# Patient Record
Sex: Male | Born: 1970 | Race: White | Hispanic: No | Marital: Single | Smoking: Never smoker
Health system: Southern US, Community
[De-identification: ages and names within clinical notes are randomized; demographics above are authoritative.]

## PROBLEM LIST (undated history)

## (undated) HISTORY — PX: NO PAST SURGERIES: SHX2092

---

## 2004-10-17 ENCOUNTER — Ambulatory Visit: Payer: Self-pay | Admitting: Family Medicine

## 2012-05-12 LAB — CBC AND DIFFERENTIAL
HEMATOCRIT: 44 % (ref 41–53)
HEMOGLOBIN: 15.1 g/dL (ref 13.5–17.5)
Platelets: 264 10*3/uL (ref 150–399)
WBC: 5.5 10^3/mL

## 2012-05-12 LAB — LIPID PANEL
Cholesterol: 147 mg/dL (ref 0–200)
HDL: 30 mg/dL — AB (ref 35–70)
LDL Cholesterol: 85 mg/dL
LDL/HDL RATIO: 2.8
TRIGLYCERIDES: 160 mg/dL (ref 40–160)

## 2012-05-12 LAB — TSH: TSH: 1.12 u[IU]/mL (ref <=5.90)

## 2014-04-04 LAB — BASIC METABOLIC PANEL
BUN: 14 mg/dL (ref 4–21)
CREATININE: 0.9 mg/dL (ref <=1.3)
GLUCOSE: 101 mg/dL
POTASSIUM: 4.3 mmol/L (ref 3.4–5.3)
Sodium: 141 mmol/L (ref 137–147)

## 2014-04-04 LAB — HEPATIC FUNCTION PANEL
ALT: 45 U/L — AB (ref 10–40)
AST: 31 U/L (ref 14–40)

## 2014-08-28 DIAGNOSIS — I10 Essential (primary) hypertension: Secondary | ICD-10-CM | POA: Insufficient documentation

## 2014-08-28 DIAGNOSIS — E669 Obesity, unspecified: Secondary | ICD-10-CM | POA: Insufficient documentation

## 2014-10-10 ENCOUNTER — Ambulatory Visit: Payer: Self-pay | Admitting: Family Medicine

## 2014-11-14 ENCOUNTER — Encounter: Payer: Self-pay | Admitting: Family Medicine

## 2014-11-14 ENCOUNTER — Ambulatory Visit (INDEPENDENT_AMBULATORY_CARE_PROVIDER_SITE_OTHER): Payer: Managed Care, Other (non HMO) | Admitting: Family Medicine

## 2014-11-14 VITALS — BP 128/80 | HR 80 | Temp 98.2°F | Resp 16 | Wt 294.0 lb

## 2014-11-14 DIAGNOSIS — E669 Obesity, unspecified: Secondary | ICD-10-CM | POA: Diagnosis not present

## 2014-11-14 DIAGNOSIS — I10 Essential (primary) hypertension: Secondary | ICD-10-CM | POA: Diagnosis not present

## 2014-11-14 DIAGNOSIS — Z23 Encounter for immunization: Secondary | ICD-10-CM

## 2014-11-14 NOTE — Progress Notes (Signed)
Patient ID: Robert Dalton, male   DOB: 1970-10-18, 44 y.o.   MRN: [id number]    Subjective:  HPI  Hypertension, follow-up:  BP Readings from Last 3 Encounters:  04/04/14 122/82    He was last seen for hypertension 6 months ago.  BP at that visit was 122/82. Management changes since that visit include none. He reports good compliance with treatment. He is exercising, mainly job related. He is adherent to low salt diet.   Outside blood pressures are not being checked. He is experiencing none.   Weight trend: stable Wt Readings from Last 3 Encounters:  04/04/14 287 lb (130.182 kg)     ------------------------------------------------------------------------     Prior to Admission medications   Medication Sig Start Date End Date Taking? Authorizing Provider  MULTIPLE VITAMINS PO Take by mouth.    Historical Provider, MD    Patient Active Problem List   Diagnosis Date Noted  . Obesity 11/14/2014  . Essential (primary) hypertension 08/28/2014  . Adiposity 08/28/2014    No past medical history on file.  History   Social History  . Marital Status: Single    Spouse Name: N/A  . Number of Children: N/A  . Years of Education: N/A   Occupational History  . Not on file.   Social History Main Topics  . Smoking status: Not on file  . Smokeless tobacco: Not on file  . Alcohol Use: Not on file  . Drug Use: Not on file  . Sexual Activity: Not on file   Other Topics Concern  . Not on file   Social History Narrative  . No narrative on file    Allergies  Allergen Reactions  . Prednisone Swelling    Legs    Review of Systems  Constitutional: Negative.   HENT: Negative.   Eyes: Negative.   Respiratory: Negative.   Cardiovascular: Negative.   Gastrointestinal: Negative.   Skin: Negative.   Neurological: Negative.        A full tingling and both hands. Very vague with onset says this feels like is a stiffness also.  Psychiatric/Behavioral: Negative.   All  other systems reviewed and are negative.   Immunization History  Administered Date(s) Administered  . Td 09/30/2004   Objective:  Blood pressure 128/80, pulse 80, temperature 98.2 F (36.8 C), temperature source Oral, resp. rate 16, weight 294 lb (133.358 kg).   Physical Exam  Constitutional: He is oriented to person, place, and time and well-developed, well-nourished, and in no distress.  HENT:  Head: Normocephalic and atraumatic.  Right Ear: External ear normal.  Left Ear: External ear normal.  Nose: Nose normal.  Eyes: Conjunctivae are normal. Pupils are equal, round, and reactive to light.  Neck: Normal range of motion. Neck supple.  Cardiovascular: Normal rate, regular rhythm and normal heart sounds.   Pulmonary/Chest: Effort normal and breath sounds normal.  Abdominal: Soft. Bowel sounds are normal.  Neurological: He is alert and oriented to person, place, and time.  Skin: Skin is warm and dry.  Psychiatric: Mood, memory, affect and judgment normal.    Lab Results  Component Value Date   WBC 5.5 05/12/2012   HGB 15.1 05/12/2012   HCT 44 05/12/2012   PLT 264 05/12/2012   CHOL 147 05/12/2012   TRIG 160 05/12/2012   HDL 30* 05/12/2012   LDLCALC 85 05/12/2012   TSH 1.12 05/12/2012    CMP     Component Value Date/Time   NA 141 04/04/2014   K  4.3 04/04/2014   BUN 14 04/04/2014   CREATININE 0.9 04/04/2014   AST 31 04/04/2014   ALT 45* 04/04/2014    Assessment and Plan :   Obesity D and E discussed. Waist is 42 inches--long term goal is 36 inches.  I have done the exam and reviewed the above chart and it is accurate to the best of my knowledge.   Miguel Aschoff MD  [facility] 11/14/2014 3:10 PM

## 2015-01-23 ENCOUNTER — Ambulatory Visit: Payer: Managed Care, Other (non HMO) | Admitting: Family Medicine

## 2015-05-20 ENCOUNTER — Ambulatory Visit (INDEPENDENT_AMBULATORY_CARE_PROVIDER_SITE_OTHER): Payer: BC Managed Care – PPO | Admitting: Family Medicine

## 2015-05-20 VITALS — BP 142/92 | HR 68 | Temp 98.2°F | Resp 16 | Ht 72.0 in | Wt 293.0 lb

## 2015-05-20 DIAGNOSIS — Z87898 Personal history of other specified conditions: Secondary | ICD-10-CM

## 2015-05-20 DIAGNOSIS — I10 Essential (primary) hypertension: Secondary | ICD-10-CM

## 2015-05-20 DIAGNOSIS — Z9189 Other specified personal risk factors, not elsewhere classified: Secondary | ICD-10-CM

## 2015-05-20 MED ORDER — LISINOPRIL 40 MG PO TABS: 40.0000 mg | ORAL_TABLET | Freq: Every day | ORAL | Status: DC

## 2015-05-20 NOTE — Progress Notes (Signed)
Patient ID: Robert Dalton, male   DOB: June 08, 1970, 45 y.o.   MRN: [id number]   JAKYRON FABRO  MRN: [id number] DOB: Mar 27, 1971  Subjective:  HPI   1. Essential hypertension The patient is a 45 year old male who was originally scheduled for an annual physical.  However, he states he has had 2 CPE's this year through his work.  He has had a DOT PE and then he had one for [facility] therefor he does not think he needs a PE today.  He is concerned that he needs to be put back on his BP medication.  He was on Lisinopril at one time and had lost a lot of weight and was able to discontinue the medication.  Since then he has gained 40+ pounds and is having borderline readings.     Patient Active Problem List   Diagnosis Date Noted  . Obesity 11/14/2014  . Essential (primary) hypertension 08/28/2014  . Adiposity 08/28/2014    No past medical history on file.  Social History   Social History  . Marital Status: Single    Spouse Name: N/A  . Number of Children: N/A  . Years of Education: N/A   Occupational History  . Not on file.   Social History Main Topics  . Smoking status: Never Smoker   . Smokeless tobacco: Not on file  . Alcohol Use: No  . Drug Use: No  . Sexual Activity: Not on file   Other Topics Concern  . Not on file   Social History Narrative    Outpatient Prescriptions Prior to Visit  Medication Sig Dispense Refill  . MULTIPLE VITAMINS PO Take by mouth.     No facility-administered medications prior to visit.    Allergies  Allergen Reactions  . Prednisone Swelling    Legs    Review of Systems  Constitutional: Negative for fever and malaise/fatigue.  Respiratory: Negative for cough, shortness of breath and wheezing.   Cardiovascular: Negative for chest pain, palpitations, orthopnea and leg swelling.  Neurological: Negative for dizziness, weakness and headaches.   Objective:  BP 142/92 mmHg  Pulse 68  Temp(Src) 98.2 F (36.8 C) (Oral)  Resp  16  Ht 6' (1.829 m)  Wt 293 lb (132.904 kg)  BMI 39.73 kg/m2  Physical Exam  Assessment and Plan :   1. Essential hypertension  - COMPLETE METABOLIC PANEL WITH GFR - TSH - CBC With Differential/Platelet - lisinopril (PRINIVIL,ZESTRIL) 40 MG tablet; Take 1 tablet (40 mg total) by mouth daily.  Dispense: 90 tablet; Refill: 3 RTC 1-2 months. 2. At risk for cardiac dysfunction  - Lipid Panel With LDL/HDL Ratio 3. obesity  Miguel Aschoff MD [facility] 05/20/2015 9:08 AM

## 2015-05-21 LAB — LIPID PANEL WITH LDL/HDL RATIO
CHOLESTEROL TOTAL: 188 mg/dL (ref 100–199)
HDL: 36 mg/dL — ABNORMAL LOW (ref >39)
LDL Calculated: 119 mg/dL — ABNORMAL HIGH (ref 0–99)
LDl/HDL Ratio: 3.3 ratio units (ref 0.0–3.6)
Triglycerides: 165 mg/dL — ABNORMAL HIGH (ref 0–149)
VLDL Cholesterol Cal: 33 mg/dL (ref 5–40)

## 2015-05-21 LAB — CBC WITH DIFFERENTIAL/PLATELET
BASOS: 0 %
Basophils Absolute: 0 10*3/uL (ref 0.0–0.2)
EOS (ABSOLUTE): 0 10*3/uL (ref 0.0–0.4)
EOS: 0 %
HEMATOCRIT: 46.9 % (ref 37.5–51.0)
HEMOGLOBIN: 16.7 g/dL (ref 12.6–17.7)
IMMATURE GRANS (ABS): 0 10*3/uL (ref 0.0–0.1)
Immature Granulocytes: 0 %
LYMPHS ABS: 2.5 10*3/uL (ref 0.7–3.1)
Lymphs: 37 %
MCH: 31.5 pg (ref 26.6–33.0)
MCHC: 35.6 g/dL (ref 31.5–35.7)
MCV: 88 fL (ref 79–97)
MONOCYTES: 6 %
Monocytes Absolute: 0.4 10*3/uL (ref 0.1–0.9)
NEUTROS PCT: 57 %
Neutrophils Absolute: 3.8 10*3/uL (ref 1.4–7.0)
Platelets: 254 10*3/uL (ref 150–379)
RBC: 5.31 x10E6/uL (ref 4.14–5.80)
RDW: 14.1 % (ref 12.3–15.4)
WBC: 6.8 10*3/uL (ref 3.4–10.8)

## 2015-05-21 LAB — TSH: TSH: 1.55 u[IU]/mL (ref 0.450–4.500)

## 2015-05-21 LAB — COMPREHENSIVE METABOLIC PANEL
ALBUMIN: 4.3 g/dL (ref 3.5–5.5)
ALT: 51 IU/L — ABNORMAL HIGH (ref 0–44)
AST: 29 IU/L (ref 0–40)
Albumin/Globulin Ratio: 1.8 (ref 1.1–2.5)
Alkaline Phosphatase: 94 IU/L (ref 39–117)
BUN / CREAT RATIO: 10 (ref 9–20)
BUN: 10 mg/dL (ref 6–24)
Bilirubin Total: 0.6 mg/dL (ref 0.0–1.2)
CO2: 22 mmol/L (ref 18–29)
CREATININE: 0.96 mg/dL (ref 0.76–1.27)
Calcium: 9.4 mg/dL (ref 8.7–10.2)
Chloride: 101 mmol/L (ref 96–106)
GFR, EST AFRICAN AMERICAN: 112 mL/min/{1.73_m2} (ref >59)
GFR, EST NON AFRICAN AMERICAN: 97 mL/min/{1.73_m2} (ref >59)
GLOBULIN, TOTAL: 2.4 g/dL (ref 1.5–4.5)
GLUCOSE: 99 mg/dL (ref 65–99)
Potassium: 4.7 mmol/L (ref 3.5–5.2)
SODIUM: 139 mmol/L (ref 134–144)
TOTAL PROTEIN: 6.7 g/dL (ref 6.0–8.5)

## 2016-09-21 ENCOUNTER — Ambulatory Visit (INDEPENDENT_AMBULATORY_CARE_PROVIDER_SITE_OTHER): Payer: Self-pay | Admitting: Family Medicine

## 2016-09-21 ENCOUNTER — Encounter: Payer: Self-pay | Admitting: Family Medicine

## 2016-09-21 VITALS — BP 142/68 | HR 72 | Temp 98.4°F | Resp 16 | Ht 71.0 in | Wt 303.0 lb

## 2016-09-21 DIAGNOSIS — I10 Essential (primary) hypertension: Secondary | ICD-10-CM

## 2016-09-21 DIAGNOSIS — H01004 Unspecified blepharitis left upper eyelid: Secondary | ICD-10-CM

## 2016-09-21 DIAGNOSIS — Z0000 Encounter for general adult medical examination without abnormal findings: Secondary | ICD-10-CM

## 2016-09-21 MED ORDER — CIPROFLOXACIN HCL 0.3 % OP SOLN: 2.0000 [drp] | OPHTHALMIC | 1 refills | Status: DC

## 2016-09-21 MED ORDER — LISINOPRIL 40 MG PO TABS: 40.0000 mg | ORAL_TABLET | Freq: Every day | ORAL | 3 refills | Status: DC

## 2016-09-21 NOTE — Progress Notes (Signed)
Patient: Robert Dalton Male    DOB: 09-20-1970   46 y.o.   MRN: [id number] Visit Date: 09/21/2016  Today's Provider: Wilhemena Durie, MD   Chief Complaint  Patient presents with  . Hypertension  . Eye Pain   Subjective:    HPI  Hypertension, follow-up:  BP Readings from Last 3 Encounters:  09/21/16 (!) 142/68  05/20/15 (!) 142/92  11/14/14 128/80    He was last seen for hypertension 1 years ago.  BP at that visit was 142/92. Management since that visit includes . He reports good compliance with treatment. He is not having side effects.  He is not exercising. He is adherent to low salt diet.   Outside blood pressures are not being checked. He is experiencing none.  Patient denies exertional chest pressure/discomfort, lower extremity edema and palpitations.    Weight trend: stable Wt Readings from Last 3 Encounters:  09/21/16 (!) 303 lb (137.4 kg)  05/20/15 293 lb (132.9 kg)  11/14/14 294 lb (133.4 kg)    Current diet: well balanced  Ref Range & Units 101yrago  Glucose 65 - 99 mg/dL 99   BUN 6 - 24 mg/dL 10   Creatinine, Ser 0.76 - 1.27 mg/dL 0.96   GFR calc non Af Amer >59 mL/min/1.73 97   GFR calc Af Amer >59 mL/min/1.73 112   BUN/Creatinine Ratio 9 - 20 10   Sodium 134 - 144 mmol/L 139   Potassium 3.5 - 5.2 mmol/L 4.7   Chloride 96 - 106 mmol/L 101   CO2 18 - 29 mmol/L 22   Calcium 8.7 - 10.2 mg/dL 9.4   Total Protein 6.0 - 8.5 g/dL 6.7   Albumin 3.5 - 5.5 g/dL 4.3   Globulin, Total 1.5 - 4.5 g/dL 2.4   Albumin/Globulin Ratio 1.1 - 2.5 1.8   Bilirubin Total 0.0 - 1.2 mg/dL 0.6   Alkaline Phosphatase 39 - 117 IU/L 94   AST 0 - 40 IU/L 29   ALT 0 - 44 IU/L 51      Ref Range & Units 172yrgo  Cholesterol, Total 100 - 199 mg/dL 188   Triglycerides 0 - 149 mg/dL 165    HDL >39 mg/dL 36    VLDL Cholesterol Cal 5 - 40 mg/dL 33   LDL Calculated 0 - 99 mg/dL 119    LDl/HDL Ratio 0.0 - 3.6 ratio units 3.3        Eye pain: Patient reports that  he may have a stye on his upper left eyelid. He reports that he has had symptoms for about 2 days. Patient has been using OTC eye drops with no relief.     Allergies  Allergen Reactions  . Prednisone Swelling    Legs     Current Outpatient Prescriptions:  .  lisinopril (PRINIVIL,ZESTRIL) 40 MG tablet, Take 1 tablet (40 mg total) by mouth daily., Disp: 90 tablet, Rfl: 3 .  MULTIPLE VITAMINS PO, Take by mouth., Disp: , Rfl:   Review of Systems  Constitutional: Negative.   Eyes: Positive for pain and redness. Negative for discharge and itching.  Respiratory: Negative.   Cardiovascular: Negative.     Social History  Substance Use Topics  . Smoking status: Never Smoker  . Smokeless tobacco: Never Used  . Alcohol use No   Objective:   BP (!) 142/68 (BP Location: Right Arm, Patient Position: Sitting, Cuff Size: Large)   Pulse 72   Temp 98.4 F (  36.9 C)   Resp 16   Ht 5' 11"  (1.803 m)   Wt (!) 303 lb (137.4 kg)   SpO2 97%   BMI 42.26 kg/m  Vitals:   09/21/16 0912  BP: (!) 142/68  Pulse: 72  Resp: 16  Temp: 98.4 F (36.9 C)  SpO2: 97%  Weight: (!) 303 lb (137.4 kg)  Height: 5' 11"  (1.803 m)     Physical Exam  Constitutional: He appears well-developed and well-nourished.  Eyes:  Left upper lid is diffusely swollen.   Cardiovascular: Normal rate, regular rhythm and normal heart sounds.   Pulmonary/Chest: Effort normal and breath sounds normal.        Assessment & Plan:     1. Essential (primary) hypertension  - Comprehensive metabolic panel - TSH  2. Essential hypertension  - lisinopril (PRINIVIL,ZESTRIL) 40 MG tablet; Take 1 tablet (40 mg total) by mouth daily.  Dispense: 90 tablet; Refill: 3  3. Blepharitis of left upper eyelid, unspecified type Warm compresses and Johnson       I have done the exam and reviewed the above chart and it is accurate to the best of my knowledge. Development worker, community has been used in this note in any air is in the  dictation or transcription are unintentional.  Wilhemena Durie, MD  [place]

## 2016-09-22 LAB — COMPREHENSIVE METABOLIC PANEL
A/G RATIO: 1.5 (ref 1.2–2.2)
ALBUMIN: 4.1 g/dL (ref 3.5–5.5)
ALK PHOS: 87 IU/L (ref 39–117)
ALT: 56 IU/L — ABNORMAL HIGH (ref 0–44)
AST: 36 IU/L (ref 0–40)
BUN/Creatinine Ratio: 9 (ref 9–20)
BUN: 10 mg/dL (ref 6–24)
Bilirubin Total: 0.6 mg/dL (ref 0.0–1.2)
CO2: 26 mmol/L (ref 18–29)
CREATININE: 1.14 mg/dL (ref 0.76–1.27)
Calcium: 9.6 mg/dL (ref 8.7–10.2)
Chloride: 101 mmol/L (ref 96–106)
GFR calc Af Amer: 90 mL/min/{1.73_m2} (ref >59)
GFR, EST NON AFRICAN AMERICAN: 78 mL/min/{1.73_m2} (ref >59)
GLOBULIN, TOTAL: 2.8 g/dL (ref 1.5–4.5)
Glucose: 99 mg/dL (ref 65–99)
POTASSIUM: 4.8 mmol/L (ref 3.5–5.2)
SODIUM: 140 mmol/L (ref 134–144)
Total Protein: 6.9 g/dL (ref 6.0–8.5)

## 2016-09-22 LAB — TSH: TSH: 1.92 u[IU]/mL (ref 0.450–4.500)

## 2016-09-24 ENCOUNTER — Telehealth: Payer: Self-pay

## 2016-09-24 NOTE — Telephone Encounter (Signed)
-----   Message from Jerrol Banana., MD sent at 09/23/2016 10:42 AM EDT ----- Stable. Work on diet and exercise.

## 2016-09-24 NOTE — Telephone Encounter (Signed)
Tried calling patient to advised him of lab results below.  Left message to call back.

## 2017-07-07 ENCOUNTER — Encounter: Payer: Self-pay | Admitting: Family Medicine

## 2017-07-07 ENCOUNTER — Ambulatory Visit: Payer: Self-pay | Admitting: Family Medicine

## 2017-07-07 VITALS — BP 134/86 | HR 80 | Temp 98.2°F | Resp 16 | Wt 329.0 lb

## 2017-07-07 DIAGNOSIS — Z6841 Body Mass Index (BMI) 40.0 and over, adult: Secondary | ICD-10-CM

## 2017-07-07 DIAGNOSIS — I10 Essential (primary) hypertension: Secondary | ICD-10-CM

## 2017-07-07 NOTE — Progress Notes (Signed)
       Patient: Robert Dalton Male    DOB: 03/08/1971   47 y.o.   MRN: [id number] Visit Date: 07/07/2017  Today's Provider: Wilhemena Durie, MD   Chief Complaint  Patient presents with  . Hypertension    follow up   Subjective:    Hypertension  This is a chronic problem. The problem is unchanged. The problem is controlled. Pertinent negatives include no anxiety, blurred vision, chest pain, headaches, neck pain, orthopnea, palpitations, peripheral edema, PND, shortness of breath or sweats. There are no associated agents to hypertension. Risk factors for coronary artery disease include male gender and obesity. Past treatments include ACE inhibitors. There are no compliance problems.    BP Readings from Last 3 Encounters:  07/07/17 134/86  09/21/16 (!) 142/68  05/20/15 (!) 142/92        Allergies  Allergen Reactions  . Prednisone Swelling    Legs     Current Outpatient Medications:  .  lisinopril (PRINIVIL,ZESTRIL) 40 MG tablet, Take 1 tablet (40 mg total) by mouth daily., Disp: 90 tablet, Rfl: 3 .  MULTIPLE VITAMINS PO, Take by mouth., Disp: , Rfl:  .  ciprofloxacin (CILOXAN) 0.3 % ophthalmic solution, Place 2 drops into the left eye every 2 (two) hours. Administer 1 drop, every 2 hours, while awake, for 2 days. Then 1 drop, every 4 hours, while awake, for the next 5 days., Disp: 10 mL, Rfl: 1  Review of Systems  Constitutional: Negative.   Eyes: Negative for blurred vision.  Respiratory: Negative.  Negative for shortness of breath.   Cardiovascular: Negative.  Negative for chest pain, palpitations, orthopnea and PND.  Musculoskeletal: Negative for neck pain.  Neurological: Negative for dizziness, light-headedness and headaches.    Social History   Tobacco Use  . Smoking status: Never Smoker  . Smokeless tobacco: Never Used  Substance Use Topics  . Alcohol use: No   Objective:   BP 134/86 (BP Location: Right Arm, Patient Position: Sitting, Cuff Size: Large)    Pulse 80   Temp 98.2 F (36.8 C) (Oral)   Resp 16   Wt (!) 329 lb (149.2 kg)   BMI 45.89 kg/m  Vitals:   07/07/17 0913  BP: 134/86  Pulse: 80  Resp: 16  Temp: 98.2 F (36.8 C)  TempSrc: Oral  Weight: (!) 329 lb (149.2 kg)     Physical Exam  Constitutional: He is oriented to person, place, and time. He appears well-developed and well-nourished.  HENT:  Head: Normocephalic and atraumatic.  Eyes: Conjunctivae are normal. No scleral icterus.  Neck: No thyromegaly present.  Cardiovascular: Normal rate, regular rhythm and normal heart sounds.  Pulmonary/Chest: Effort normal and breath sounds normal.  Abdominal: Soft.  Neurological: He is alert and oriented to person, place, and time.  Skin: Skin is warm and dry.  Psychiatric: He has a normal mood and affect. His behavior is normal. Judgment and thought content normal.        Assessment & Plan:       HTN Obesity RTC 6 months.    Rolla Servidio Cranford Mon, MD  [facility]

## 2018-01-12 ENCOUNTER — Encounter: Payer: Self-pay | Admitting: Family Medicine

## 2018-02-23 ENCOUNTER — Other Ambulatory Visit: Payer: Self-pay | Admitting: Family Medicine

## 2018-02-23 DIAGNOSIS — I10 Essential (primary) hypertension: Secondary | ICD-10-CM

## 2018-02-23 MED ORDER — LISINOPRIL 40 MG PO TABS: 40.0000 mg | ORAL_TABLET | Freq: Every day | ORAL | 1 refills | Status: DC

## 2018-02-23 NOTE — Telephone Encounter (Signed)
Pt needing a 3 month supply refill on: lisinopril (PRINIVIL,ZESTRIL) 40 MG tablet - pt's appt w/ Rosanna Randy isn't until Dec.  Please fill at:  [facility] Address: [address] [place], [place] [address] Phone: [phone]   Thanks, [facility]

## 2018-02-24 ENCOUNTER — Other Ambulatory Visit: Payer: Self-pay

## 2018-02-24 DIAGNOSIS — I10 Essential (primary) hypertension: Secondary | ICD-10-CM

## 2018-02-24 MED ORDER — LISINOPRIL 40 MG PO TABS: 40.0000 mg | ORAL_TABLET | Freq: Every day | ORAL | 1 refills | Status: DC

## 2018-05-03 ENCOUNTER — Encounter: Payer: Self-pay | Admitting: Family Medicine

## 2018-05-17 ENCOUNTER — Encounter: Payer: Self-pay | Admitting: Family Medicine

## 2018-05-17 ENCOUNTER — Ambulatory Visit (INDEPENDENT_AMBULATORY_CARE_PROVIDER_SITE_OTHER): Payer: BLUE CROSS/BLUE SHIELD | Admitting: Family Medicine

## 2018-05-17 VITALS — BP 122/78 | HR 70 | Temp 98.3°F | Resp 16 | Ht 70.0 in | Wt 319.0 lb

## 2018-05-17 DIAGNOSIS — Z800 Family history of malignant neoplasm of digestive organs: Secondary | ICD-10-CM

## 2018-05-17 DIAGNOSIS — Z0000 Encounter for general adult medical examination without abnormal findings: Secondary | ICD-10-CM | POA: Diagnosis not present

## 2018-05-17 DIAGNOSIS — Z125 Encounter for screening for malignant neoplasm of prostate: Secondary | ICD-10-CM

## 2018-05-17 LAB — POCT URINALYSIS DIPSTICK
Bilirubin, UA: NEGATIVE
Blood, UA: NEGATIVE
Glucose, UA: NEGATIVE
Ketones, UA: NEGATIVE
Leukocytes, UA: NEGATIVE
Nitrite, UA: NEGATIVE
Protein, UA: NEGATIVE
Spec Grav, UA: 1.02 (ref 1.010–1.025)
UROBILINOGEN UA: 0.2 U/dL
pH, UA: 6 (ref 5.0–8.0)

## 2018-05-17 NOTE — Progress Notes (Signed)
Patient: Robert Dalton, Male    DOB: January 07, 1971, 48 y.o.   MRN: [id number] Visit Date: 05/17/2018  Today's Provider: Wilhemena Durie, MD   Chief Complaint  Patient presents with  . Annual Exam   Subjective:    Annual physical exam Robert Dalton is a 48 y.o. male who presents today for health maintenance and complete physical. He feels well. He reports exercising not regularly . He reports he is sleeping well.  He is single and lives in [place].  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  Feels well overall.  Td- 11/14/2014.     Review of Systems  Constitutional: Negative.   HENT: Negative.   Eyes: Negative.   Respiratory: Negative.   Cardiovascular: Negative.   Gastrointestinal: Negative.   Endocrine: Negative.   Genitourinary: Negative.   Musculoskeletal: Negative.   Skin: Negative.   Allergic/Immunologic: Negative.   Neurological: Negative.   Hematological: Negative.   Psychiatric/Behavioral: Negative.     Social History      He  reports that he has never smoked. He has never used smokeless tobacco. He reports that he does not drink alcohol or use drugs.       Social History   Socioeconomic History  . Marital status: Single    Spouse name: Not on file  . Number of children: Not on file  . Years of education: Not on file  . Highest education level: Not on file  Occupational History  . Not on file  Social Needs  . Financial resource strain: Not on file  . Food insecurity:    Worry: Not on file    Inability: Not on file  . Transportation needs:    Medical: Not on file    Non-medical: Not on file  Tobacco Use  . Smoking status: Never Smoker  . Smokeless tobacco: Never Used  Substance and Sexual Activity  . Alcohol use: No  . Drug use: No  . Sexual activity: Not on file  Lifestyle  . Physical activity:    Days per week: Not on file    Minutes per session: Not on file  . Stress: Not on file  Relationships  . Social  connections:    Talks on phone: Not on file    Gets together: Not on file    Attends religious service: Not on file    Active member of club or organization: Not on file    Attends meetings of clubs or organizations: Not on file    Relationship status: Not on file  Other Topics Concern  . Not on file  Social History Narrative  . Not on file    No past medical history on file.   Patient Active Problem List   Diagnosis Date Noted  . Obesity 11/14/2014  . Essential (primary) hypertension 08/28/2014  . Adiposity 08/28/2014    No past surgical history on file.  Family History        Family Status  Relation Name Status  . Mother  Alive  . Sister  Alive  . Brother  Alive  . Father  (Not Specified)        His family history includes Alcohol abuse in his father; Allergies in his father; Colon cancer in his father.      Allergies  Allergen Reactions  . Prednisone Swelling    Legs     Current Outpatient Medications:  .  lisinopril (PRINIVIL,ZESTRIL) 40 MG tablet,  Take 1 tablet (40 mg total) by mouth daily., Disp: 90 tablet, Rfl: 1 .  MULTIPLE VITAMINS PO, Take by mouth., Disp: , Rfl:  .  ciprofloxacin (CILOXAN) 0.3 % ophthalmic solution, Place 2 drops into the left eye every 2 (two) hours. Administer 1 drop, every 2 hours, while awake, for 2 days. Then 1 drop, every 4 hours, while awake, for the next 5 days. (Patient not taking: Reported on 05/17/2018), Disp: 10 mL, Rfl: 1   Patient Care Team: Jerrol Banana., MD as PCP - General (Family Medicine)      Objective:   Vitals: BP 122/78 (BP Location: Left Arm, Patient Position: Sitting, Cuff Size: Large)   Pulse 70   Temp 98.3 F (36.8 C)   Resp 16   Ht 5' 10"  (1.778 m)   Wt (!) 319 lb (144.7 kg)   SpO2 98%   BMI 45.77 kg/m    Vitals:   05/17/18 0911  BP: 122/78  Pulse: 70  Resp: 16  Temp: 98.3 F (36.8 C)  SpO2: 98%  Weight: (!) 319 lb (144.7 kg)  Height: 5' 10"  (1.778 m)     Physical  Exam Constitutional:      Appearance: Normal appearance. He is well-developed. He is obese.  HENT:     Head: Normocephalic and atraumatic.     Right Ear: External ear normal.     Left Ear: External ear normal.     Nose: Nose normal.  Eyes:     Conjunctiva/sclera: Conjunctivae normal.  Neck:     Thyroid: No thyromegaly.  Cardiovascular:     Rate and Rhythm: Normal rate and regular rhythm.     Pulses: Normal pulses.     Heart sounds: Normal heart sounds.  Pulmonary:     Effort: Pulmonary effort is normal.     Breath sounds: Normal breath sounds.  Abdominal:     Palpations: Abdomen is soft.  Genitourinary:    Penis: Normal.      Scrotum/Testes: Normal.  Skin:    General: Skin is warm and dry.  Neurological:     General: No focal deficit present.     Mental Status: He is alert and oriented to person, place, and time. Mental status is at baseline.  Psychiatric:        Mood and Affect: Mood normal.        Behavior: Behavior normal.        Thought Content: Thought content normal.        Judgment: Judgment normal.      Depression Screen PHQ 2/9 Scores 05/17/2018 09/21/2016  PHQ - 2 Score 0 0  PHQ- 9 Score - 0      Assessment & Plan:     Routine Health Maintenance and Physical Exam  Exercise Activities and Dietary recommendations Goals   None     Immunization History  Administered Date(s) Administered  . MMR 11/13/1991  . Td 09/30/2004, 11/14/2014    Health Maintenance  Topic Date Due  . HIV Screening  08/10/1987  . INFLUENZA VACCINE  12/02/2017  . TETANUS/TDAP  11/13/2024     Discussed health benefits of physical activity, and encouraged him to engage in regular exercise appropriate for his age and condition.  1. Annual physical exam  - CBC with Differential/Platelet - Comprehensive metabolic panel - Lipid panel - PSA - TSH - POCT urinalysis dipstick  2. Prostate cancer screening   3. Family history of colon cancer in father Discussed  colonoscopy but patient  prefers Cologuard. Father had colon cancer in his 65s. - Cologuard  I, [place] Presley, CMA, am acting as a Education administrator for [employer]. Rosanna Randy, MD.   I have done the exam and reviewed the above chart and it is accurate to the best of my knowledge. Development worker, community has been used in this note in any air is in the dictation or transcription are unintentional.  Wilhemena Durie, MD  [place]

## 2018-05-18 LAB — CBC WITH DIFFERENTIAL/PLATELET
BASOS ABS: 0 10*3/uL (ref 0.0–0.2)
Basos: 0 %
EOS (ABSOLUTE): 0 10*3/uL (ref 0.0–0.4)
Eos: 0 %
Hematocrit: 46.6 % (ref 37.5–51.0)
Hemoglobin: 16.6 g/dL (ref 13.0–17.7)
Immature Grans (Abs): 0 10*3/uL (ref 0.0–0.1)
Immature Granulocytes: 0 %
Lymphocytes Absolute: 2.4 10*3/uL (ref 0.7–3.1)
Lymphs: 31 %
MCH: 31.1 pg (ref 26.6–33.0)
MCHC: 35.6 g/dL (ref 31.5–35.7)
MCV: 87 fL (ref 79–97)
Monocytes Absolute: 0.6 10*3/uL (ref 0.1–0.9)
Monocytes: 8 %
Neutrophils Absolute: 4.8 10*3/uL (ref 1.4–7.0)
Neutrophils: 61 %
Platelets: 259 10*3/uL (ref 150–450)
RBC: 5.34 x10E6/uL (ref 4.14–5.80)
RDW: 13.4 % (ref 11.6–15.4)
WBC: 7.9 10*3/uL (ref 3.4–10.8)

## 2018-05-18 LAB — COMPREHENSIVE METABOLIC PANEL
ALT: 30 IU/L (ref 0–44)
AST: 23 IU/L (ref 0–40)
Albumin/Globulin Ratio: 1.4 (ref 1.2–2.2)
Albumin: 4.2 g/dL (ref 3.5–5.5)
Alkaline Phosphatase: 101 IU/L (ref 39–117)
BUN / CREAT RATIO: 11 (ref 9–20)
BUN: 11 mg/dL (ref 6–24)
Bilirubin Total: 0.6 mg/dL (ref 0.0–1.2)
CO2: 24 mmol/L (ref 20–29)
Calcium: 9.3 mg/dL (ref 8.7–10.2)
Chloride: 101 mmol/L (ref 96–106)
Creatinine, Ser: 0.97 mg/dL (ref 0.76–1.27)
GFR calc Af Amer: 109 mL/min/{1.73_m2} (ref >59)
GFR calc non Af Amer: 94 mL/min/{1.73_m2} (ref >59)
GLUCOSE: 102 mg/dL — AB (ref 65–99)
Globulin, Total: 3 g/dL (ref 1.5–4.5)
Potassium: 4.6 mmol/L (ref 3.5–5.2)
Sodium: 139 mmol/L (ref 134–144)
Total Protein: 7.2 g/dL (ref 6.0–8.5)

## 2018-05-18 LAB — LIPID PANEL
CHOLESTEROL TOTAL: 169 mg/dL (ref 100–199)
Chol/HDL Ratio: 5 ratio (ref 0.0–5.0)
HDL: 34 mg/dL — ABNORMAL LOW (ref >39)
LDL Calculated: 97 mg/dL (ref 0–99)
Triglycerides: 188 mg/dL — ABNORMAL HIGH (ref 0–149)
VLDL Cholesterol Cal: 38 mg/dL (ref 5–40)

## 2018-05-18 LAB — PSA: Prostate Specific Ag, Serum: 0.5 ng/mL (ref 0.0–4.0)

## 2018-05-18 LAB — TSH: TSH: 1.53 u[IU]/mL (ref 0.450–4.500)

## 2018-06-14 LAB — COLOGUARD: Cologuard: NEGATIVE

## 2018-06-20 ENCOUNTER — Telehealth: Payer: Self-pay

## 2018-06-20 NOTE — Telephone Encounter (Signed)
Patient advised.

## 2018-06-20 NOTE — Telephone Encounter (Signed)
Called patient to advise that cologuard results are negative. Left message to call back.

## 2018-06-20 NOTE — Telephone Encounter (Signed)
Pt returned call ° °teri °

## 2018-08-23 ENCOUNTER — Other Ambulatory Visit: Payer: Self-pay | Admitting: Family Medicine

## 2018-08-23 DIAGNOSIS — I10 Essential (primary) hypertension: Secondary | ICD-10-CM

## 2019-02-21 ENCOUNTER — Other Ambulatory Visit: Payer: Self-pay | Admitting: Family Medicine

## 2019-02-21 DIAGNOSIS — I10 Essential (primary) hypertension: Secondary | ICD-10-CM

## 2019-08-29 NOTE — Progress Notes (Deleted)
Complete physical exam   Patient: Robert Dalton   DOB: [birth year]   49 y.o. Male  MRN: [id number] Visit Date: 08/29/2019  Today's healthcare provider: Wilhemena Durie, MD   No chief complaint on file.  Subjective    Robert Dalton is a 49 y.o. male who presents today for a complete physical exam.  He reports consuming a {diet types:17450} diet. {Exercise:19826} He generally feels {well/fairly well/poorly:18703}. He reports sleeping {well/fairly well/poorly:18703}. He {does/does not:200015} have additional problems to discuss today.  HPI  ***  No past medical history on file. No past surgical history on file. Social History   Socioeconomic History  . Marital status: Single    Spouse name: Not on file  . Number of children: Not on file  . Years of education: Not on file  . Highest education level: Not on file  Occupational History  . Not on file  Tobacco Use  . Smoking status: Never Smoker  . Smokeless tobacco: Never Used  Substance and Sexual Activity  . Alcohol use: No  . Drug use: No  . Sexual activity: Not on file  Other Topics Concern  . Not on file  Social History Narrative  . Not on file   Social Determinants of Health   Financial Resource Strain:   . Difficulty of Paying Living Expenses:   Food Insecurity:   . Worried About Charity fundraiser in the Last Year:   . Arboriculturist in the Last Year:   Transportation Needs:   . Film/video editor (Medical):   Marland Kitchen Lack of Transportation (Non-Medical):   Physical Activity:   . Days of Exercise per Week:   . Minutes of Exercise per Session:   Stress:   . Feeling of Stress :   Social Connections:   . Frequency of Communication with Friends and Family:   . Frequency of Social Gatherings with Friends and Family:   . Attends Religious Services:   . Active Member of Clubs or Organizations:   . Attends Archivist Meetings:   Marland Kitchen Marital Status:   Intimate Partner Violence:   . Fear of  Current or Ex-Partner:   . Emotionally Abused:   Marland Kitchen Physically Abused:   . Sexually Abused:    Family Status  Relation Name Status  . Mother  Alive  . Sister  Alive  . Brother  Alive  . Father  (Not Specified)   Family History  Problem Relation Age of Onset  . Alcohol abuse Father   . Colon cancer Father   . Allergies Father    Allergies  Allergen Reactions  . Prednisone Swelling    Legs    Patient Care Team: Jerrol Banana., MD as PCP - General (Family Medicine)   Medications: Outpatient Medications Prior to Visit  Medication Sig  . ciprofloxacin (CILOXAN) 0.3 % ophthalmic solution Place 2 drops into the left eye every 2 (two) hours. Administer 1 drop, every 2 hours, while awake, for 2 days. Then 1 drop, every 4 hours, while awake, for the next 5 days. (Patient not taking: Reported on 05/17/2018)  . lisinopril (ZESTRIL) 40 MG tablet TAKE 1 TABLET BY MOUTH EVERY DAY  . MULTIPLE VITAMINS PO Take by mouth.   No facility-administered medications prior to visit.    Review of Systems  {Show previous labs (optional):23779::" "}  Objective    There were no vitals taken for this visit. {Show previous vital signs (optional):23777::" "}  Physical Exam  ***  Depression Screen  PHQ 2/9 Scores 05/17/2018 09/21/2016  PHQ - 2 Score 0 0  PHQ- 9 Score - 0    No results found for any visits on 08/30/19.  Assessment & Plan    Routine Health Maintenance and Physical Exam  Exercise Activities and Dietary recommendations Goals   None     Immunization History  Administered Date(s) Administered  . MMR 11/13/1991  . Td 09/30/2004, 11/14/2014    Health Maintenance  Topic Date Due  . HIV Screening  Never done  . COVID-19 Vaccine (1) Never done  . INFLUENZA VACCINE  12/03/2019  . TETANUS/TDAP  11/13/2024    Discussed health benefits of physical activity, and encouraged him to engage in regular exercise appropriate for his age and condition.  ***  No follow-ups  on file.     {provider attestation***:1}   Wilhemena Durie, MD  [facility] [phone] (phone) [phone] (fax)  [place]

## 2019-08-30 ENCOUNTER — Encounter: Payer: BLUE CROSS/BLUE SHIELD | Admitting: Family Medicine

## 2019-09-05 NOTE — Progress Notes (Deleted)
Complete physical exam   Patient: Robert Dalton   DOB: 1970/08/18   49 y.o. Male  MRN: [id number] Visit Date: 09/06/2019  Today's healthcare provider: Wilhemena Durie, MD   No chief complaint on file.  Subjective    Robert Dalton is a 49 y.o. male who presents today for a complete physical exam.  He reports consuming a {diet types:17450} diet. {Exercise:19826} He generally feels {well/fairly well/poorly:18703}. He reports sleeping {well/fairly well/poorly:18703}. He {does/does not:200015} have additional problems to discuss today.  HPI  ***  No past medical history on file. No past surgical history on file. Social History   Socioeconomic History  . Marital status: Single    Spouse name: Not on file  . Number of children: Not on file  . Years of education: Not on file  . Highest education level: Not on file  Occupational History  . Not on file  Tobacco Use  . Smoking status: Never Smoker  . Smokeless tobacco: Never Used  Substance and Sexual Activity  . Alcohol use: No  . Drug use: No  . Sexual activity: Not on file  Other Topics Concern  . Not on file  Social History Narrative  . Not on file   Social Determinants of Health   Financial Resource Strain:   . Difficulty of Paying Living Expenses:   Food Insecurity:   . Worried About Charity fundraiser in the Last Year:   . Arboriculturist in the Last Year:   Transportation Needs:   . Film/video editor (Medical):   Marland Kitchen Lack of Transportation (Non-Medical):   Physical Activity:   . Days of Exercise per Week:   . Minutes of Exercise per Session:   Stress:   . Feeling of Stress :   Social Connections:   . Frequency of Communication with Friends and Family:   . Frequency of Social Gatherings with Friends and Family:   . Attends Religious Services:   . Active Member of Clubs or Organizations:   . Attends Archivist Meetings:   Marland Kitchen Marital Status:   Intimate Partner Violence:   . Fear of  Current or Ex-Partner:   . Emotionally Abused:   Marland Kitchen Physically Abused:   . Sexually Abused:    Family Status  Relation Name Status  . Mother  Alive  . Sister  Alive  . Brother  Alive  . Father  (Not Specified)   Family History  Problem Relation Age of Onset  . Alcohol abuse Father   . Colon cancer Father   . Allergies Father    Allergies  Allergen Reactions  . Prednisone Swelling    Legs    Patient Care Team: Jerrol Banana., MD as PCP - General (Family Medicine)   Medications: Outpatient Medications Prior to Visit  Medication Sig  . ciprofloxacin (CILOXAN) 0.3 % ophthalmic solution Place 2 drops into the left eye every 2 (two) hours. Administer 1 drop, every 2 hours, while awake, for 2 days. Then 1 drop, every 4 hours, while awake, for the next 5 days. (Patient not taking: Reported on 05/17/2018)  . lisinopril (ZESTRIL) 40 MG tablet TAKE 1 TABLET BY MOUTH EVERY DAY  . MULTIPLE VITAMINS PO Take by mouth.   No facility-administered medications prior to visit.    Review of Systems  {Show previous labs (optional):23779::" "}  Objective    There were no vitals taken for this visit. {Show previous vital signs (optional):23777::" "}  Physical Exam  ***  Depression Screen  PHQ 2/9 Scores 05/17/2018 09/21/2016  PHQ - 2 Score 0 0  PHQ- 9 Score - 0    No results found for any visits on 09/06/19.  Assessment & Plan    Routine Health Maintenance and Physical Exam  Exercise Activities and Dietary recommendations Goals   None     Immunization History  Administered Date(s) Administered  . MMR 11/13/1991  . Td 09/30/2004, 11/14/2014    Health Maintenance  Topic Date Due  . HIV Screening  Never done  . COVID-19 Vaccine (1) Never done  . INFLUENZA VACCINE  12/03/2019  . TETANUS/TDAP  11/13/2024    Discussed health benefits of physical activity, and encouraged him to engage in regular exercise appropriate for his age and condition.  ***  No follow-ups  on file.     {provider attestation***:1}   Wilhemena Durie, MD  [facility] [phone] (phone) [phone] (fax)  [facility]

## 2019-09-06 ENCOUNTER — Encounter: Payer: BC Managed Care – PPO | Admitting: Family Medicine

## 2019-09-15 NOTE — Progress Notes (Signed)
Complete physical exam   Patient: Robert Dalton   DOB: 12-12-1970   49 y.o. Male  MRN: [id number] Visit Date: 09/20/2019  Today's healthcare provider: Wilhemena Durie, MD   Chief Complaint  Patient presents with  . Annual Exam   [employer] as a scribe for Wilhemena Durie, MD.,have documented all relevant documentation on the behalf of Wilhemena Durie, MD,as directed by  Wilhemena Durie, MD while in the presence of Wilhemena Durie, MD.  Subjective    Robert Dalton is a 49 y.o. male who presents today for a complete physical exam.  He reports consuming a general diet. The patient does not participate in regular exercise at present. He generally feels well. He reports sleeping well. He does have additional problems to discuss today. Patient C/O left heel pain "feeling of fluid" that's gradually improving.  Feeling well and ready to make lifestyle changes to get the weight off.    History reviewed. No pertinent past medical history. History reviewed. No pertinent surgical history. Social History   Socioeconomic History  . Marital status: Single    Spouse name: Not on file  . Number of children: Not on file  . Years of education: Not on file  . Highest education level: Not on file  Occupational History  . Not on file  Tobacco Use  . Smoking status: Never Smoker  . Smokeless tobacco: Never Used  Substance and Sexual Activity  . Alcohol use: No  . Drug use: No  . Sexual activity: Not on file  Other Topics Concern  . Not on file  Social History Narrative  . Not on file   Social Determinants of Health   Financial Resource Strain:   . Difficulty of Paying Living Expenses:   Food Insecurity:   . Worried About Charity fundraiser in the Last Year:   . Arboriculturist in the Last Year:   Transportation Needs:   . Film/video editor (Medical):   Marland Kitchen Lack of Transportation (Non-Medical):   Physical Activity:   . Days of Exercise per Week:    . Minutes of Exercise per Session:   Stress:   . Feeling of Stress :   Social Connections:   . Frequency of Communication with Friends and Family:   . Frequency of Social Gatherings with Friends and Family:   . Attends Religious Services:   . Active Member of Clubs or Organizations:   . Attends Archivist Meetings:   Marland Kitchen Marital Status:   Intimate Partner Violence:   . Fear of Current or Ex-Partner:   . Emotionally Abused:   Marland Kitchen Physically Abused:   . Sexually Abused:    Family Status  Relation Name Status  . Mother  Alive  . Sister  Alive  . Brother  Alive  . Father  (Not Specified)   Family History  Problem Relation Age of Onset  . Alcohol abuse Father   . Colon cancer Father   . Allergies Father    Allergies  Allergen Reactions  . Prednisone Swelling    Legs    Patient Care Team: Jerrol Banana., MD as PCP - General (Family Medicine)   Medications: Outpatient Medications Prior to Visit  Medication Sig  . lisinopril (ZESTRIL) 40 MG tablet TAKE 1 TABLET BY MOUTH EVERY DAY  . MULTIPLE VITAMINS PO Take by mouth.  . [DISCONTINUED] ciprofloxacin (CILOXAN) 0.3 % ophthalmic solution Place 2 drops into the  left eye every 2 (two) hours. Administer 1 drop, every 2 hours, while awake, for 2 days. Then 1 drop, every 4 hours, while awake, for the next 5 days. (Patient not taking: Reported on 05/17/2018)   No facility-administered medications prior to visit.    Review of Systems  Constitutional: Negative.   HENT: Negative.   Eyes: Negative.   Respiratory: Negative.   Cardiovascular: Negative.   Gastrointestinal: Negative.   Endocrine: Negative.   Genitourinary: Negative.   Musculoskeletal: Negative.   Skin: Negative.   Allergic/Immunologic: Negative.   Neurological: Negative.   Hematological: Negative.   Psychiatric/Behavioral: Negative.        Objective    BP (!) 150/83 (BP Location: Right Arm, Patient Position: Sitting, Cuff Size: Large)    Pulse 86   Temp (!) 96.9 F (36.1 C) (Temporal)   Ht 5' 10"  (1.778 m)   Wt (!) 337 lb 3.2 oz (153 kg)   BMI 48.38 kg/m  Wt Readings from Last 3 Encounters:  09/20/19 (!) 337 lb 3.2 oz (153 kg)  05/17/18 (!) 319 lb (144.7 kg)  07/07/17 (!) 329 lb (149.2 kg)      Physical Exam Vitals reviewed.  Constitutional:      Appearance: Normal appearance. He is well-developed. He is obese.  HENT:     Head: Normocephalic and atraumatic.     Right Ear: External ear normal.     Left Ear: External ear normal.     Nose: Nose normal.  Eyes:     Conjunctiva/sclera: Conjunctivae normal.  Neck:     Thyroid: No thyromegaly.  Cardiovascular:     Rate and Rhythm: Normal rate and regular rhythm.     Pulses: Normal pulses.     Heart sounds: Normal heart sounds.  Pulmonary:     Effort: Pulmonary effort is normal.     Breath sounds: Normal breath sounds.  Abdominal:     Palpations: Abdomen is soft.  Genitourinary:    Penis: Normal.      Testes: Normal.  Skin:    General: Skin is warm and dry.  Neurological:     General: No focal deficit present.     Mental Status: He is alert and oriented to person, place, and time.  Psychiatric:        Mood and Affect: Mood normal.        Behavior: Behavior normal.        Thought Content: Thought content normal.        Judgment: Judgment normal.     BP (!) 150/83 (BP Location: Right Arm, Patient Position: Sitting, Cuff Size: Large)   Pulse 86   Temp (!) 96.9 F (36.1 C) (Temporal)   Ht 5' 10"  (1.778 m)   Wt (!) 337 lb 3.2 oz (153 kg)   BMI 48.38 kg/m   General Appearance:    Alert, cooperative, no distress, appears stated age  Head:    Normocephalic, without obvious abnormality, atraumatic  Eyes:    PERRL, conjunctiva/corneas clear, EOM's intact, fundi    benign, both eyes       Ears:    Normal TM's and external ear canals, both ears  Nose:   Nares normal, septum midline, mucosa normal, no drainage   or sinus tenderness  Throat:   Lips,  mucosa, and tongue normal; teeth and gums normal  Neck:   Supple, symmetrical, trachea midline, no adenopathy;       thyroid:  No enlargement/tenderness/nodules; no carotid   bruit or JVD  Back:     Symmetric, no curvature, ROM normal, no CVA tenderness  Lungs:     Clear to auscultation bilaterally, respirations unlabored  Chest wall:    No tenderness or deformity  Heart:    Regular rate and rhythm, S1 and S2 normal, no murmur, rub   or gallop  Abdomen:     Soft, non-tender, bowel sounds active all four quadrants,    no masses, no organomegaly  Genitalia:    Normal male without lesion, discharge or tenderness  Rectal:    Normal tone, normal prostate, no masses or tenderness;   guaiac negative stool  Extremities:   Extremities normal, atraumatic, no cyanosis or edema  Pulses:   2+ and symmetric all extremities  Skin:   Skin color, texture, turgor normal, no rashes or lesions  Lymph nodes:   Cervical, supraclavicular, and axillary nodes normal  Neurologic:   CNII-XII intact. Normal strength, sensation and reflexes      throughout   Depression Screen  PHQ 2/9 Scores 09/20/2019 05/17/2018 09/21/2016  PHQ - 2 Score 0 0 0  PHQ- 9 Score - - 0    No results found for any visits on 09/20/19.  Assessment & Plan    Routine Health Maintenance and Physical Exam  Exercise Activities and Dietary recommendations Goals   None     Immunization History  Administered Date(s) Administered  . MMR 11/13/1991  . Td 09/30/2004, 11/14/2014    Health Maintenance  Topic Date Due  . HIV Screening  Never done  . COVID-19 Vaccine (1) Never done  . INFLUENZA VACCINE  12/03/2019  . TETANUS/TDAP  11/13/2024    Discussed health benefits of physical activity, and encouraged him to engage in regular exercise appropriate for his age and condition.  1. Annual physical exam   2. Essential hypertension Follow up in 3 months patient to work on diet and exercise. - Lipid Profile - TSH - CBC with  Differential - Comprehensive Metabolic Panel (CMET)  3. Encounter for screening for HIV  - HIV Antibody (routine testing w rflx)  4. Prostate cancer screening  - PSA 5.  Morbid obesity 4-6 today.  Exercise discussed.  Weight watchers recommended.  Follow-up 3 months to recheck blood pressure and weight.  Return in about 3 months (around 12/21/2019).     I, Wilhemena Durie, MD, have reviewed all documentation for this visit. The documentation on 09/23/19 for the exam, diagnosis, procedures, and orders are all accurate and complete.    Robert Sexson Cranford Mon, MD  [facility] [phone] (phone) [phone] (fax)  [place]

## 2019-09-20 ENCOUNTER — Other Ambulatory Visit: Payer: Self-pay | Admitting: Family Medicine

## 2019-09-20 ENCOUNTER — Ambulatory Visit (INDEPENDENT_AMBULATORY_CARE_PROVIDER_SITE_OTHER): Payer: BC Managed Care – PPO | Admitting: Family Medicine

## 2019-09-20 ENCOUNTER — Encounter: Payer: Self-pay | Admitting: Family Medicine

## 2019-09-20 ENCOUNTER — Other Ambulatory Visit: Payer: Self-pay

## 2019-09-20 VITALS — BP 150/83 | HR 86 | Temp 96.9°F | Ht 70.0 in | Wt 337.2 lb

## 2019-09-20 DIAGNOSIS — E66813 Obesity, class 3: Secondary | ICD-10-CM

## 2019-09-20 DIAGNOSIS — Z114 Encounter for screening for human immunodeficiency virus [HIV]: Secondary | ICD-10-CM | POA: Diagnosis not present

## 2019-09-20 DIAGNOSIS — Z0000 Encounter for general adult medical examination without abnormal findings: Secondary | ICD-10-CM | POA: Diagnosis not present

## 2019-09-20 DIAGNOSIS — Z125 Encounter for screening for malignant neoplasm of prostate: Secondary | ICD-10-CM | POA: Diagnosis not present

## 2019-09-20 DIAGNOSIS — Z6841 Body Mass Index (BMI) 40.0 and over, adult: Secondary | ICD-10-CM

## 2019-09-20 DIAGNOSIS — I10 Essential (primary) hypertension: Secondary | ICD-10-CM | POA: Diagnosis not present

## 2019-09-21 LAB — TSH: TSH: 1.6 u[IU]/mL (ref 0.450–4.500)

## 2019-09-21 LAB — COMPREHENSIVE METABOLIC PANEL
ALT: 68 IU/L — ABNORMAL HIGH (ref 0–44)
AST: 41 IU/L — ABNORMAL HIGH (ref 0–40)
Albumin/Globulin Ratio: 1.6 (ref 1.2–2.2)
Albumin: 4.4 g/dL (ref 4.0–5.0)
Alkaline Phosphatase: 114 IU/L (ref 48–121)
BUN/Creatinine Ratio: 8 — ABNORMAL LOW (ref 9–20)
BUN: 9 mg/dL (ref 6–24)
Bilirubin Total: 0.6 mg/dL (ref 0.0–1.2)
CO2: 21 mmol/L (ref 20–29)
Calcium: 9.2 mg/dL (ref 8.7–10.2)
Chloride: 102 mmol/L (ref 96–106)
Creatinine, Ser: 1.06 mg/dL (ref 0.76–1.27)
GFR calc Af Amer: 96 mL/min/{1.73_m2} (ref >59)
GFR calc non Af Amer: 83 mL/min/{1.73_m2} (ref >59)
Globulin, Total: 2.7 g/dL (ref 1.5–4.5)
Glucose: 95 mg/dL (ref 65–99)
Potassium: 4.6 mmol/L (ref 3.5–5.2)
Sodium: 137 mmol/L (ref 134–144)
Total Protein: 7.1 g/dL (ref 6.0–8.5)

## 2019-09-21 LAB — LIPID PANEL
Chol/HDL Ratio: 4.8 ratio (ref 0.0–5.0)
Cholesterol, Total: 178 mg/dL (ref 100–199)
HDL: 37 mg/dL — ABNORMAL LOW (ref >39)
LDL Chol Calc (NIH): 109 mg/dL — ABNORMAL HIGH (ref 0–99)
Triglycerides: 185 mg/dL — ABNORMAL HIGH (ref 0–149)
VLDL Cholesterol Cal: 32 mg/dL (ref 5–40)

## 2019-09-21 LAB — CBC WITH DIFFERENTIAL/PLATELET
Basophils Absolute: 0 10*3/uL (ref 0.0–0.2)
Basos: 0 %
EOS (ABSOLUTE): 0 10*3/uL (ref 0.0–0.4)
Eos: 1 %
Hematocrit: 51.4 % — ABNORMAL HIGH (ref 37.5–51.0)
Hemoglobin: 17.5 g/dL (ref 13.0–17.7)
Immature Grans (Abs): 0.1 10*3/uL (ref 0.0–0.1)
Immature Granulocytes: 1 %
Lymphocytes Absolute: 2.4 10*3/uL (ref 0.7–3.1)
Lymphs: 33 %
MCH: 30.8 pg (ref 26.6–33.0)
MCHC: 34 g/dL (ref 31.5–35.7)
MCV: 91 fL (ref 79–97)
Monocytes Absolute: 0.5 10*3/uL (ref 0.1–0.9)
Monocytes: 7 %
Neutrophils Absolute: 4.3 10*3/uL (ref 1.4–7.0)
Neutrophils: 58 %
Platelets: 276 10*3/uL (ref 150–450)
RBC: 5.68 x10E6/uL (ref 4.14–5.80)
RDW: 13.8 % (ref 11.6–15.4)
WBC: 7.4 10*3/uL (ref 3.4–10.8)

## 2019-09-21 LAB — PSA: Prostate Specific Ag, Serum: 0.4 ng/mL (ref 0.0–4.0)

## 2019-09-21 LAB — HIV ANTIBODY (ROUTINE TESTING W REFLEX): HIV Screen 4th Generation wRfx: NONREACTIVE

## 2019-12-19 ENCOUNTER — Ambulatory Visit: Payer: BC Managed Care – PPO | Admitting: Family Medicine

## 2020-01-22 ENCOUNTER — Other Ambulatory Visit: Payer: Self-pay | Admitting: Family Medicine

## 2020-01-22 DIAGNOSIS — I10 Essential (primary) hypertension: Secondary | ICD-10-CM

## 2020-01-22 MED ORDER — LISINOPRIL 40 MG PO TABS: 40.0000 mg | ORAL_TABLET | Freq: Every day | ORAL | 1 refills | Status: DC

## 2020-01-22 NOTE — Telephone Encounter (Signed)
Medication refill sent to patient's pharmacy.

## 2020-01-22 NOTE — Telephone Encounter (Signed)
Medication Refill - Medication: lisinopril 69m  Has the patient contacted their pharmacy? No. (Agent: If no, request that the patient contact the pharmacy for the refill.) (Agent: If yes, when and what did the pharmacy advise?)  Preferred Pharmacy (with phone number or street name): [id number] [place], [place] Agent: Please be advised that RX refills may take up to 3 business days. We ask that you follow-up with your pharmacy.

## 2020-02-13 ENCOUNTER — Ambulatory Visit: Payer: BC Managed Care – PPO | Admitting: Family Medicine

## 2020-02-13 ENCOUNTER — Encounter: Payer: Self-pay | Admitting: Family Medicine

## 2020-02-13 ENCOUNTER — Other Ambulatory Visit: Payer: Self-pay

## 2020-02-13 VITALS — BP 120/88 | HR 95 | Temp 98.3°F | Resp 16 | Ht 70.0 in | Wt 329.0 lb

## 2020-02-13 DIAGNOSIS — I10 Essential (primary) hypertension: Secondary | ICD-10-CM | POA: Diagnosis not present

## 2020-02-13 DIAGNOSIS — Z6841 Body Mass Index (BMI) 40.0 and over, adult: Secondary | ICD-10-CM | POA: Diagnosis not present

## 2020-02-13 NOTE — Progress Notes (Signed)
I,April Miller,acting as a scribe for Wilhemena Durie, MD.,have documented all relevant documentation on the behalf of Wilhemena Durie, MD,as directed by  Wilhemena Durie, MD while in the presence of Wilhemena Durie, MD.  Established patient visit   Patient: Robert Dalton   DOB: 18-Dec-1970   49 y.o. Male  MRN: [id number] Visit Date: 02/13/2020  Today's healthcare provider: Wilhemena Durie, MD   Chief Complaint  Patient presents with  . Follow-up  . 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 Hypertension, follow-up  BP Readings from Last 3 Encounters:  09/20/19 (!) 150/83  05/17/18 122/78  07/07/17 134/86   Wt Readings from Last 3 Encounters:  09/20/19 (!) 337 lb 3.2 oz (153 kg)  05/17/18 (!) 319 lb (144.7 kg)  07/07/17 (!) 329 lb (149.2 kg)     He was last seen for hypertension 5 months ago.  BP at that visit was 150/93. Management since that visit includes; Follow up in 3 months patient to work on diet and exercise. He reports good compliance with treatment. He is not having side effects. none He is exercising. He is not adherent to low salt diet.   Outside blood pressures are not checking.  He does not smoke.  Use of agents associated with hypertension: none.   --------------------------------------------------------------------      Medications: Outpatient Medications Prior to Visit  Medication Sig  . lisinopril (ZESTRIL) 40 MG tablet Take 1 tablet (40 mg total) by mouth daily.  . MULTIPLE VITAMINS PO Take by mouth.   No facility-administered medications prior to visit.    Review of Systems  Constitutional: Negative for appetite change, chills and fever.  Respiratory: Negative for chest tightness, shortness of breath and wheezing.   Cardiovascular: Negative for chest pain and palpitations.  Gastrointestinal: Negative for abdominal pain, nausea and vomiting.       Objective    There were no  vitals taken for this visit. BP Readings from Last 3 Encounters:  02/13/20 120/88  09/20/19 (!) 150/83  05/17/18 122/78   Wt Readings from Last 3 Encounters:  02/13/20 (!) 329 lb (149.2 kg)  09/20/19 (!) 337 lb 3.2 oz (153 kg)  05/17/18 (!) 319 lb (144.7 kg)      Physical Exam Vitals reviewed.  Constitutional:      Appearance: Normal appearance. He is well-developed. He is obese.  HENT:     Head: Normocephalic and atraumatic.     Right Ear: External ear normal.     Left Ear: External ear normal.     Nose: Nose normal.  Eyes:     Conjunctiva/sclera: Conjunctivae normal.  Neck:     Thyroid: No thyromegaly.  Cardiovascular:     Rate and Rhythm: Normal rate and regular rhythm.     Pulses: Normal pulses.     Heart sounds: Normal heart sounds.  Pulmonary:     Effort: Pulmonary effort is normal.     Breath sounds: Normal breath sounds.  Abdominal:     Palpations: Abdomen is soft.  Skin:    General: Skin is warm and dry.  Neurological:     General: No focal deficit present.     Mental Status: He is alert and oriented to person, place, and time.  Psychiatric:        Mood and Affect: Mood normal.        Behavior: Behavior normal.  Thought Content: Thought content normal.        Judgment: Judgment normal.       No results found for any visits on 02/13/20.  Assessment & Plan       No follow-ups on file.      I, Wilhemena Durie, MD, have reviewed all documentation for this visit. The documentation on 02/18/20 for the exam, diagnosis, procedures, and orders are all accurate and complete.    Loma [place] Cranford Mon, MD  [facility] [phone] (phone) [phone] (fax)  [place]

## 2020-07-18 ENCOUNTER — Other Ambulatory Visit: Payer: Self-pay | Admitting: Family Medicine

## 2020-07-18 DIAGNOSIS — I10 Essential (primary) hypertension: Secondary | ICD-10-CM

## 2020-07-18 NOTE — Telephone Encounter (Signed)
Requested Prescriptions  Pending Prescriptions Disp Refills  . lisinopril (ZESTRIL) 40 MG tablet [Pharmacy Med Name: LISINOPRIL 40 MG TABLET] 90 tablet 0    Sig: TAKE 1 TABLET BY MOUTH EVERY DAY     Cardiovascular:  ACE Inhibitors Failed - 07/18/2020  1:35 AM      Failed - Cr in normal range and within 180 days    Creatinine, Ser  Date Value Ref Range Status  09/20/2019 1.06 0.76 - 1.27 mg/dL Final         Failed - K in normal range and within 180 days    Potassium  Date Value Ref Range Status  09/20/2019 4.6 3.5 - 5.2 mmol/L Final         Passed - Patient is not pregnant      Passed - Last BP in normal range    BP Readings from Last 1 Encounters:  02/13/20 120/88         Passed - Valid encounter within last 6 months    Recent Outpatient Visits          5 months ago Essential (primary) hypertension   [facility] Jerrol Banana., MD   10 months ago Annual physical exam   [facility] Jerrol Banana., MD   2 years ago Annual physical exam   [facility] Jerrol Banana., MD   3 years ago Essential (primary) hypertension   [facility] Jerrol Banana., MD   3 years ago Essential (primary) hypertension   [facility] Jerrol Banana., MD      Future Appointments            In 2 months Jerrol Banana., MD [facility], [place]

## 2020-10-08 ENCOUNTER — Encounter: Payer: Self-pay | Admitting: Family Medicine

## 2020-10-16 ENCOUNTER — Other Ambulatory Visit: Payer: Self-pay | Admitting: Family Medicine

## 2020-10-16 DIAGNOSIS — I10 Essential (primary) hypertension: Secondary | ICD-10-CM

## 2020-10-16 NOTE — Telephone Encounter (Signed)
Requested Prescriptions  Pending Prescriptions Disp Refills  . lisinopril (ZESTRIL) 40 MG tablet [Pharmacy Med Name: LISINOPRIL 40 MG TABLET] 30 tablet 0    Sig: TAKE 1 TABLET BY MOUTH EVERY DAY     Cardiovascular:  ACE Inhibitors Failed - 10/16/2020  1:10 AM      Failed - Cr in normal range and within 180 days    Creatinine, Ser  Date Value Ref Range Status  09/20/2019 1.06 0.76 - 1.27 mg/dL Final         Failed - K in normal range and within 180 days    Potassium  Date Value Ref Range Status  09/20/2019 4.6 3.5 - 5.2 mmol/L Final         Failed - Valid encounter within last 6 months    Recent Outpatient Visits          8 months ago Essential (primary) hypertension   [facility] Jerrol Banana., MD   1 year ago Annual physical exam   [facility] Jerrol Banana., MD   2 years ago Annual physical exam   [facility] Jerrol Banana., MD   3 years ago Essential (primary) hypertension   [facility] Jerrol Banana., MD   4 years ago Essential (primary) hypertension   [facility] Jerrol Banana., MD      Future Appointments            In 3 months Jerrol Banana., MD [facility] [place] - Patient is not pregnant      Passed - Last BP in normal range    BP Readings from Last 1 Encounters:  02/13/20 120/88         Patient needs an office visit for further refills. Courtesy refill.

## 2020-11-08 ENCOUNTER — Other Ambulatory Visit: Payer: Self-pay | Admitting: Family Medicine

## 2020-11-08 DIAGNOSIS — I10 Essential (primary) hypertension: Secondary | ICD-10-CM

## 2020-11-08 NOTE — Telephone Encounter (Signed)
  Notes to clinic:  Patient is schedule for appointment on 02/05/2021  Review for refills until that time    Requested Prescriptions  Pending Prescriptions Disp Refills   lisinopril (ZESTRIL) 40 MG tablet [Pharmacy Med Name: LISINOPRIL 40 MG TABLET] 30 tablet 0    Sig: TAKE 1 TABLET BY MOUTH EVERY DAY      Cardiovascular:  ACE Inhibitors Failed - 11/08/2020  9:30 AM      Failed - Cr in normal range and within 180 days    Creatinine, Ser  Date Value Ref Range Status  09/20/2019 1.06 0.76 - 1.27 mg/dL Final          Failed - K in normal range and within 180 days    Potassium  Date Value Ref Range Status  09/20/2019 4.6 3.5 - 5.2 mmol/L Final          Failed - Valid encounter within last 6 months    Recent Outpatient Visits           8 months ago Essential (primary) hypertension   [facility] Jerrol Banana., MD   1 year ago Annual physical exam   [facility] Jerrol Banana., MD   2 years ago Annual physical exam   [facility] Jerrol Banana., MD   3 years ago Essential (primary) hypertension   [facility] Jerrol Banana., MD   4 years ago Essential (primary) hypertension   [facility] Jerrol Banana., MD       Future Appointments             In 2 months Jerrol Banana., MD [facility] [place] - Patient is not pregnant      Passed - Last BP in normal range    BP Readings from Last 1 Encounters:  02/13/20 120/88

## 2021-02-05 ENCOUNTER — Other Ambulatory Visit: Payer: Self-pay

## 2021-02-05 ENCOUNTER — Ambulatory Visit (INDEPENDENT_AMBULATORY_CARE_PROVIDER_SITE_OTHER): Payer: BC Managed Care – PPO | Admitting: Family Medicine

## 2021-02-05 ENCOUNTER — Encounter: Payer: Self-pay | Admitting: Family Medicine

## 2021-02-05 VITALS — BP 128/84 | HR 72 | Temp 98.5°F | Ht 71.0 in | Wt 333.0 lb

## 2021-02-05 DIAGNOSIS — Z0000 Encounter for general adult medical examination without abnormal findings: Secondary | ICD-10-CM

## 2021-02-05 DIAGNOSIS — R109 Unspecified abdominal pain: Secondary | ICD-10-CM

## 2021-02-05 DIAGNOSIS — E66813 Obesity, class 3: Secondary | ICD-10-CM

## 2021-02-05 DIAGNOSIS — Z1211 Encounter for screening for malignant neoplasm of colon: Secondary | ICD-10-CM

## 2021-02-05 DIAGNOSIS — R1011 Right upper quadrant pain: Secondary | ICD-10-CM

## 2021-02-05 DIAGNOSIS — Z125 Encounter for screening for malignant neoplasm of prostate: Secondary | ICD-10-CM

## 2021-02-05 DIAGNOSIS — I10 Essential (primary) hypertension: Secondary | ICD-10-CM | POA: Diagnosis not present

## 2021-02-05 DIAGNOSIS — Z6841 Body Mass Index (BMI) 40.0 and over, adult: Secondary | ICD-10-CM

## 2021-02-05 LAB — POCT URINALYSIS DIPSTICK
Bilirubin, UA: NEGATIVE
Blood, UA: NEGATIVE
Glucose, UA: NEGATIVE
Ketones, UA: NEGATIVE
Leukocytes, UA: NEGATIVE
Nitrite, UA: NEGATIVE
Protein, UA: NEGATIVE
Spec Grav, UA: >=1.03 — AB (ref 1.010–1.025)
Urobilinogen, UA: 0.2 E.U./dL
pH, UA: 5 (ref 5.0–8.0)

## 2021-02-05 MED ORDER — CLENPIQ 10-3.5-12 MG-GM -GM/160ML PO SOLN: 1.0000 | Freq: Once | ORAL | 0 refills | Status: AC

## 2021-02-05 NOTE — Progress Notes (Signed)
Gastroenterology Pre-Procedure Review  Request Date: 05/12/2021 Requesting Physician: Dr. Vicente Males  PATIENT REVIEW QUESTIONS: The patient responded to the following health history questions as indicated:    1. Are you having any GI issues? no 2. Do you have a personal history of Polyps? no 3. Do you have a family history of Colon Cancer or Polyps? yes (Father colon polyps) 4. Diabetes Mellitus? no 5. Joint replacements in the past 12 months?no 6. Major health problems in the past 3 months?no 7. Any artificial heart valves, MVP, or defibrillator?no    MEDICATIONS & ALLERGIES:    Patient reports the following regarding taking any anticoagulation/antiplatelet therapy:   Plavix, Coumadin, Eliquis, Xarelto, Lovenox, Pradaxa, Brilinta, or Effient? no Aspirin? no  Patient confirms/reports the following medications:  Current Outpatient Medications  Medication Sig Dispense Refill   lisinopril (ZESTRIL) 40 MG tablet TAKE 1 TABLET BY MOUTH EVERY DAY 90 tablet 1   MULTIPLE VITAMINS PO Take by mouth.     No current facility-administered medications for this visit.    Patient confirms/reports the following allergies:  Allergies  Allergen Reactions   Prednisone Swelling    Legs    No orders of the defined types were placed in this encounter.   AUTHORIZATION INFORMATION Primary Insurance: 1D#: Group #:  Secondary Insurance: 1D#: Group #:  SCHEDULE INFORMATION: Date: 05/12/2021 Time: Location: [place]

## 2021-02-05 NOTE — Progress Notes (Signed)
Complete physical exam   Patient: Robert Dalton   DOB: Jul 08, 1970   50 y.o. Male  MRN: [id number] Visit Date: 02/05/2021  Today's healthcare provider: Wilhemena Durie, MD   No chief complaint on file.  Subjective    [name] is a 50 y.o. male who presents today for a complete physical exam.  He reports consuming a general diet. The patient does not participate in regular exercise at present. He generally feels well. He reports sleeping well. He does have additional problems to discuss today.  He is planning to go to nursing school in the future. HPI  Pt complaining of right sided pain around his ribs. Pt says it started about a year ago.  The pain has been coming and going but seems to be worsening.  Seems to be worse with  certain movements.  No past medical history on file. Past Surgical History:  Procedure Laterality Date   NO PAST SURGERIES     Social History   Socioeconomic History   Marital status: Single    Spouse name: Not on file   Number of children: Not on file   Years of education: Not on file   Highest education level: Not on file  Occupational History   Not on file  Tobacco Use   Smoking status: Never   Smokeless tobacco: Never  Vaping Use   Vaping Use: Never used  Substance and Sexual Activity   Alcohol use: No   Drug use: No   Sexual activity: Not on file  Other Topics Concern   Not on file  Social History Narrative   Not on file   Social Determinants of Health   Financial Resource Strain: Not on file  Food Insecurity: Not on file  Transportation Needs: Not on file  Physical Activity: Not on file  Stress: Not on file  Social Connections: Not on file  Intimate Partner Violence: Not on file   Family Status  Relation Name Status   Mother  Alive   Father  Alive   Sister  Alive   Brother  Alive   MGM  Alive   Neg Hx  (Not Specified)   Family History  Problem Relation Age of Onset   Healthy Mother    Alcohol abuse Father     Colon cancer Father    Allergies Father    Breast cancer Maternal Grandmother    Prostate cancer Neg Hx    Allergies  Allergen Reactions   Prednisone Swelling    Legs    Patient Care Team: Jerrol Banana., MD as PCP - General (Family Medicine)   Medications: Outpatient Medications Prior to Visit  Medication Sig   lisinopril (ZESTRIL) 40 MG tablet TAKE 1 TABLET BY MOUTH EVERY DAY   MULTIPLE VITAMINS PO Take by mouth.   No facility-administered medications prior to visit.    Review of Systems  All other systems reviewed and are negative.    Objective    BP 128/84 (BP Location: Right Arm, Patient Position: Sitting, Cuff Size: Large)   Pulse 72   Temp 98.5 F (36.9 C) (Oral)   Ht _0  (1.803 m)   Wt (!) 333 lb (151 kg)   SpO2 97%   BMI 46.44 kg/m    Physical Exam Vitals reviewed.  Constitutional:      Appearance: He is well-developed. He is obese.  HENT:     Head: Normocephalic and atraumatic.     Right Ear:  External ear normal.     Left Ear: External ear normal.     Nose: Nose normal.  Eyes:     Conjunctiva/sclera: Conjunctivae normal.  Neck:     Thyroid: No thyromegaly.  Cardiovascular:     Rate and Rhythm: Normal rate and regular rhythm.     Pulses: Normal pulses.     Heart sounds: Normal heart sounds.  Pulmonary:     Effort: Pulmonary effort is normal.     Breath sounds: Normal breath sounds.  Abdominal:     Palpations: Abdomen is soft.  Genitourinary:    Penis: Normal.      Testes: Normal.  Skin:    General: Skin is warm and dry.  Neurological:     General: No focal deficit present.     Mental Status: He is alert and oriented to person, place, and time.  Psychiatric:        Mood and Affect: Mood normal.        Behavior: Behavior normal.        Thought Content: Thought content normal.        Judgment: Judgment normal.      Last depression screening scores PHQ 2/9 Scores 02/13/2020 09/20/2019 05/17/2018  PHQ - 2 Score 0 0 0   PHQ- 9 Score 2 - -   Last fall risk screening Fall Risk  09/20/2019  Falls in the past year? 0  Number falls in past yr: 0  Injury with Fall? 0   Last Audit-C alcohol use screening Alcohol Use Disorder Test (AUDIT) 02/13/2020  1. How often do you have a drink containing alcohol? 0  2. How many drinks containing alcohol do you have on a typical day when you are drinking? 0  3. How often do you have six or more drinks on one occasion? 0  AUDIT-C Score 0  Alcohol Brief Interventions/Follow-up AUDIT Score <7 follow-up not indicated   A score of 3 or more in women, and 4 or more in men indicates increased risk for alcohol abuse, EXCEPT if all of the points are from question 1   No results found for any visits on 02/05/21.  Assessment & Plan    Routine Health Maintenance and Physical Exam  Exercise Activities and Dietary recommendations  Goals   None     Immunization History  Administered Date(s) Administered   MMR 11/13/1991   Td 09/30/2004, 11/14/2014    Health Maintenance  Topic Date Due   COVID-19 Vaccine (1) Never done   Hepatitis C Screening  Never done   INFLUENZA VACCINE  08/01/2021 (Originally 12/02/2020)   TETANUS/TDAP  11/13/2024   HIV Screening  Completed   HPV VACCINES  Aged Out    Discussed health benefits of physical activity, and encouraged him to engage in regular exercise appropriate for his age and condition.  1. Annual physical exam Refer to GI for consideration of screening colonoscopy - Lipid panel - TSH - CBC w/Diff/Platelet - Comprehensive Metabolic Panel (CMET)  2. Essential (primary) hypertension Controlled - Lipid panel - TSH - CBC w/Diff/Platelet - Comprehensive Metabolic Panel (CMET)  3. Class 3 severe obesity due to excess calories without serious comorbidity with body mass index (BMI) of 45.0 to 49.9 in adult [facility]) Diet and exercise stressed. - Lipid panel - TSH - CBC w/Diff/Platelet - Comprehensive Metabolic Panel (CMET)  4.  Prostate cancer screening  - PSA  5. Flank pain Obtain gallbladder/right upper quadrant ultrasound.  I really think this is musculoskeletal. - POCT  urinalysis dipstick  6. RUQ abdominal pain  - US Abdomen Limited RUQ (LIVER/GB)  7. Encounter for screening colonoscopy  - Ambulatory referral to Gastroenterology   No follow-ups on file.     I, Wilhemena Durie, MD, have reviewed all documentation for this visit. The documentation on 02/09/21 for the exam, diagnosis, procedures, and orders are all accurate and complete.    Hooper Petteway Cranford Mon, MD  [facility] [phone] (phone) [phone] (fax)  [place]

## 2021-02-06 LAB — COMPREHENSIVE METABOLIC PANEL
ALT: 44 IU/L (ref 0–44)
AST: 32 IU/L (ref 0–40)
Albumin/Globulin Ratio: 1.4 (ref 1.2–2.2)
Albumin: 4.3 g/dL (ref 4.0–5.0)
Alkaline Phosphatase: 113 IU/L (ref 44–121)
BUN/Creatinine Ratio: 9 (ref 9–20)
BUN: 9 mg/dL (ref 6–24)
Bilirubin Total: 0.9 mg/dL (ref 0.0–1.2)
CO2: 23 mmol/L (ref 20–29)
Calcium: 9.4 mg/dL (ref 8.7–10.2)
Chloride: 102 mmol/L (ref 96–106)
Creatinine, Ser: 1.02 mg/dL (ref 0.76–1.27)
Globulin, Total: 3.1 g/dL (ref 1.5–4.5)
Glucose: 96 mg/dL (ref 70–99)
Potassium: 4.7 mmol/L (ref 3.5–5.2)
Sodium: 140 mmol/L (ref 134–144)
Total Protein: 7.4 g/dL (ref 6.0–8.5)
eGFR: 91 mL/min/{1.73_m2} (ref >59)

## 2021-02-06 LAB — CBC WITH DIFFERENTIAL/PLATELET
Basophils Absolute: 0 10*3/uL (ref 0.0–0.2)
Basos: 0 %
EOS (ABSOLUTE): 0 10*3/uL (ref 0.0–0.4)
Eos: 0 %
Hematocrit: 50 % (ref 37.5–51.0)
Hemoglobin: 17.4 g/dL (ref 13.0–17.7)
Immature Grans (Abs): 0 10*3/uL (ref 0.0–0.1)
Immature Granulocytes: 0 %
Lymphocytes Absolute: 2.4 10*3/uL (ref 0.7–3.1)
Lymphs: 26 %
MCH: 31.1 pg (ref 26.6–33.0)
MCHC: 34.8 g/dL (ref 31.5–35.7)
MCV: 89 fL (ref 79–97)
Monocytes Absolute: 0.6 10*3/uL (ref 0.1–0.9)
Monocytes: 7 %
Neutrophils Absolute: 6 10*3/uL (ref 1.4–7.0)
Neutrophils: 67 %
Platelets: 261 10*3/uL (ref 150–450)
RBC: 5.6 x10E6/uL (ref 4.14–5.80)
RDW: 12.9 % (ref 11.6–15.4)
WBC: 9.1 10*3/uL (ref 3.4–10.8)

## 2021-02-06 LAB — LIPID PANEL
Chol/HDL Ratio: 5.2 ratio — ABNORMAL HIGH (ref 0.0–5.0)
Cholesterol, Total: 186 mg/dL (ref 100–199)
HDL: 36 mg/dL — ABNORMAL LOW (ref >39)
LDL Chol Calc (NIH): 121 mg/dL — ABNORMAL HIGH (ref 0–99)
Triglycerides: 160 mg/dL — ABNORMAL HIGH (ref 0–149)
VLDL Cholesterol Cal: 29 mg/dL (ref 5–40)

## 2021-02-06 LAB — PSA: Prostate Specific Ag, Serum: 0.4 ng/mL (ref 0.0–4.0)

## 2021-02-06 LAB — TSH: TSH: 1.43 u[IU]/mL (ref 0.450–4.500)

## 2021-02-17 ENCOUNTER — Other Ambulatory Visit: Payer: Self-pay

## 2021-02-17 ENCOUNTER — Ambulatory Visit
Admission: RE | Admit: 2021-02-17 | Discharge: 2021-02-17 | Disposition: A | Discharge status: Home / Self Care | Payer: BC Managed Care – PPO | Source: Ambulatory Visit | Attending: Family Medicine | Admitting: Family Medicine

## 2021-02-17 DIAGNOSIS — R1011 Right upper quadrant pain: Secondary | ICD-10-CM | POA: Diagnosis not present

## 2021-05-08 ENCOUNTER — Telehealth: Payer: Self-pay

## 2021-05-08 NOTE — Telephone Encounter (Signed)
Patient called to cancel his procedure patient stated he will not be rescheduling with [place]

## 2021-05-09 ENCOUNTER — Other Ambulatory Visit: Payer: Self-pay | Admitting: Family Medicine

## 2021-05-09 DIAGNOSIS — I10 Essential (primary) hypertension: Secondary | ICD-10-CM

## 2021-05-09 NOTE — Telephone Encounter (Signed)
Requested Prescriptions  Pending Prescriptions Disp Refills   lisinopril (ZESTRIL) 40 MG tablet [Pharmacy Med Name: LISINOPRIL 40 MG TABLET] 90 tablet 0    Sig: TAKE 1 TABLET BY MOUTH EVERY DAY     Cardiovascular:  ACE Inhibitors Passed - 05/09/2021  1:21 AM      Passed - Cr in normal range and within 180 days    Creatinine, Ser  Date Value Ref Range Status  02/05/2021 1.02 0.76 - 1.27 mg/dL Final         Passed - K in normal range and within 180 days    Potassium  Date Value Ref Range Status  02/05/2021 4.7 3.5 - 5.2 mmol/L Final         Passed - Patient is not pregnant      Passed - Last BP in normal range    BP Readings from Last 1 Encounters:  02/05/21 128/84         Passed - Valid encounter within last 6 months    Recent Outpatient Visits          3 months ago Annual physical exam   [facility] Jerrol Banana., MD   1 year ago Essential (primary) hypertension   [facility] Jerrol Banana., MD   1 year ago Annual physical exam   [facility] Jerrol Banana., MD   2 years ago Annual physical exam   [facility] Jerrol Banana., MD   3 years ago Essential (primary) hypertension   [facility] Jerrol Banana., MD      Future Appointments            In 7 months Jerrol Banana., MD [facility], [place]

## 2021-05-12 ENCOUNTER — Ambulatory Visit
Admission: RE | Admit: 2021-05-12 | Payer: BC Managed Care – PPO | Source: Home / Self Care | Admitting: Gastroenterology

## 2021-05-12 ENCOUNTER — Encounter: Admission: RE | Payer: Self-pay | Source: Home / Self Care

## 2021-05-12 SURGERY — COLONOSCOPY WITH PROPOFOL
Anesthesia: General

## 2021-08-13 ENCOUNTER — Other Ambulatory Visit: Payer: Self-pay | Admitting: Family Medicine

## 2021-08-13 DIAGNOSIS — I10 Essential (primary) hypertension: Secondary | ICD-10-CM

## 2021-08-13 NOTE — Telephone Encounter (Signed)
Requested Prescriptions  ?Pending Prescriptions Disp Refills  ?? lisinopril (ZESTRIL) 40 MG tablet [Pharmacy Med Name: LISINOPRIL 40 MG TABLET] 90 tablet 0  ?  Sig: TAKE 1 TABLET BY MOUTH EVERY DAY  ?  ? Cardiovascular:  ACE Inhibitors Failed - 08/13/2021  1:51 AM  ?  ?  Failed - Cr in normal range and within 180 days  ?  Creatinine, Ser  ?Date Value Ref Range Status  ?02/05/2021 1.02 0.76 - 1.27 mg/dL Final  ?   ?  ?  Failed - K in normal range and within 180 days  ?  Potassium  ?Date Value Ref Range Status  ?02/05/2021 4.7 3.5 - 5.2 mmol/L Final  ?   ?  ?  Failed - Valid encounter within last 6 months  ?  Recent Outpatient Visits   ?      ? 6 months ago Annual physical exam  ? [facility] Jerrol Banana., MD  ? 1 year ago Essential (primary) hypertension  ? [facility] Jerrol Banana., MD  ? 1 year ago Annual physical exam  ? [facility] Jerrol Banana., MD  ? 3 years ago Annual physical exam  ? [facility] Jerrol Banana., MD  ? 4 years ago Essential (primary) hypertension  ? [facility] Jerrol Banana., MD  ?  ?  ? ?  ?  ?  Passed - Patient is not pregnant  ?  ?  Passed - Last BP in normal range  ?  BP Readings from Last 1 Encounters:  ?02/05/21 128/84  ?   ?  ?  ? ? ?

## 2021-11-14 ENCOUNTER — Other Ambulatory Visit: Payer: Self-pay | Admitting: Family Medicine

## 2021-11-14 DIAGNOSIS — I10 Essential (primary) hypertension: Secondary | ICD-10-CM

## 2021-11-14 NOTE — Telephone Encounter (Signed)
Attempted to call patient to schedule follow up appointment- left message to call office. Courtesy #[phone] given Requested Prescriptions  Pending Prescriptions Disp Refills  . lisinopril (ZESTRIL) 40 MG tablet [Pharmacy Med Name: LISINOPRIL 40 MG TABLET] 90 tablet 0    Sig: TAKE 1 TABLET BY MOUTH EVERY DAY     Cardiovascular:  ACE Inhibitors Failed - 11/14/2021  1:53 AM      Failed - Cr in normal range and within 180 days    Creatinine, Ser  Date Value Ref Range Status  02/05/2021 1.02 0.76 - 1.27 mg/dL Final         Failed - K in normal range and within 180 days    Potassium  Date Value Ref Range Status  02/05/2021 4.7 3.5 - 5.2 mmol/L Final         Failed - Valid encounter within last 6 months    Recent Outpatient Visits          9 months ago Annual physical exam   [facility] Maple Hudson., MD   1 year ago Essential (primary) hypertension   [facility] Maple Hudson., MD   2 years ago Annual physical exam   [facility] Maple Hudson., MD   3 years ago Annual physical exam   [facility] Maple Hudson., MD   4 years ago Essential (primary) hypertension   [facility] Maple Hudson., MD             Passed - Patient is not pregnant      Passed - Last BP in normal range    BP Readings from Last 1 Encounters:  02/05/21 128/84

## 2021-12-10 ENCOUNTER — Encounter: Payer: BC Managed Care – PPO | Admitting: Family Medicine

## 2021-12-19 ENCOUNTER — Other Ambulatory Visit: Payer: Self-pay | Admitting: Family Medicine

## 2021-12-19 DIAGNOSIS — I10 Essential (primary) hypertension: Secondary | ICD-10-CM

## 2022-01-14 ENCOUNTER — Other Ambulatory Visit: Payer: Self-pay | Admitting: Family Medicine

## 2022-01-14 DIAGNOSIS — I10 Essential (primary) hypertension: Secondary | ICD-10-CM

## 2023-04-22 IMAGING — US US ABDOMEN LIMITED
1 series · 14 of 25 positions shown · non-contrast
Comparison: No prior.

CLINICAL DATA: Right upper quadrant pain.

EXAM:
ULTRASOUND ABDOMEN LIMITED RIGHT UPPER QUADRANT

[Series 1: us abdomen limited · 0.28mm/px · 14 of 45 slices shown]
[im 1/45]
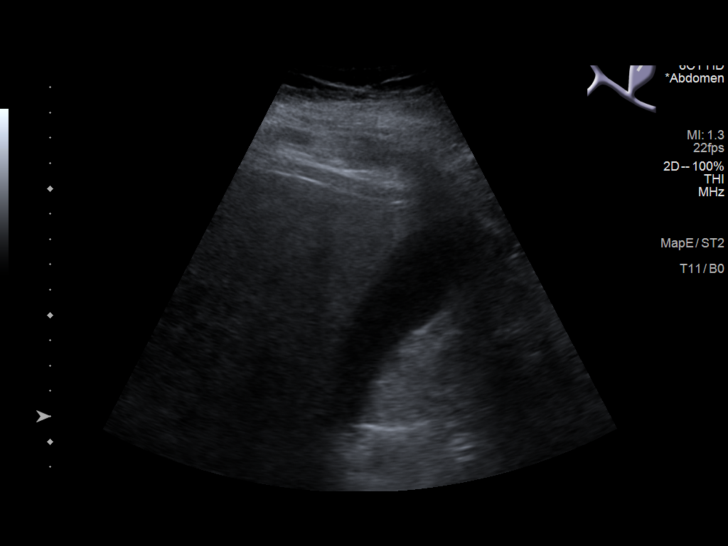
[im 4/45]
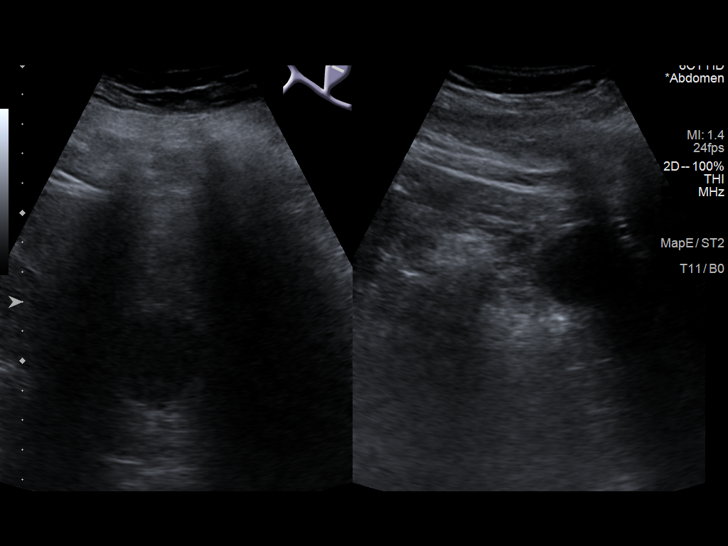
[im 8/45]
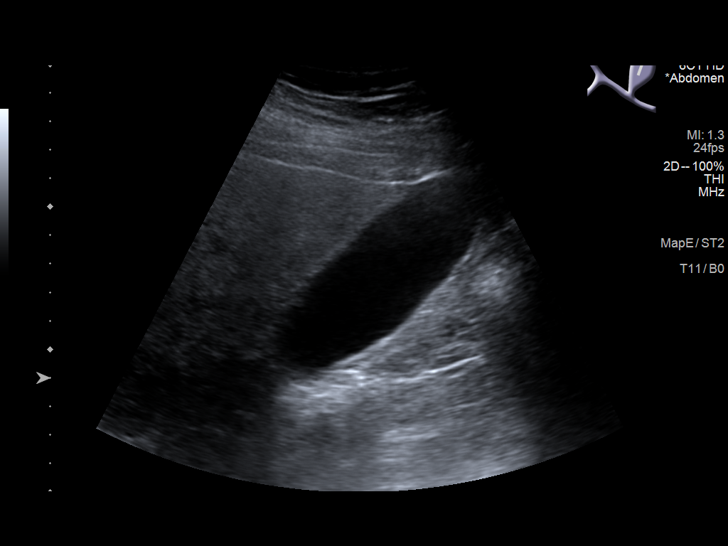
[im 12/45]
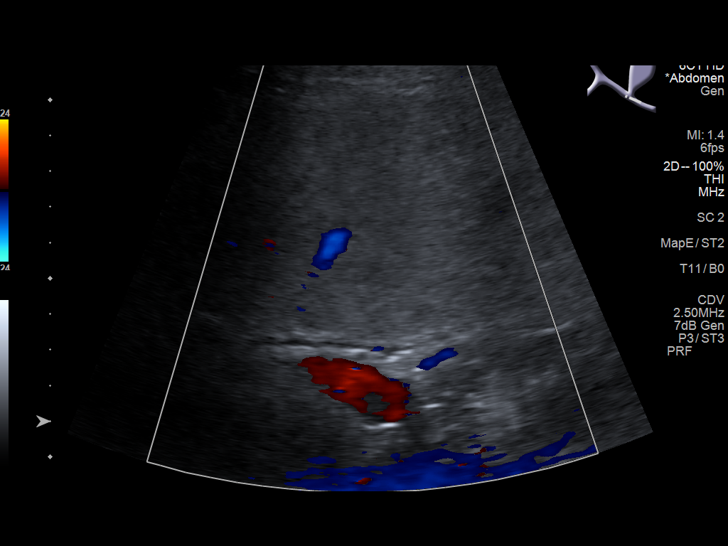
[im 15/45]
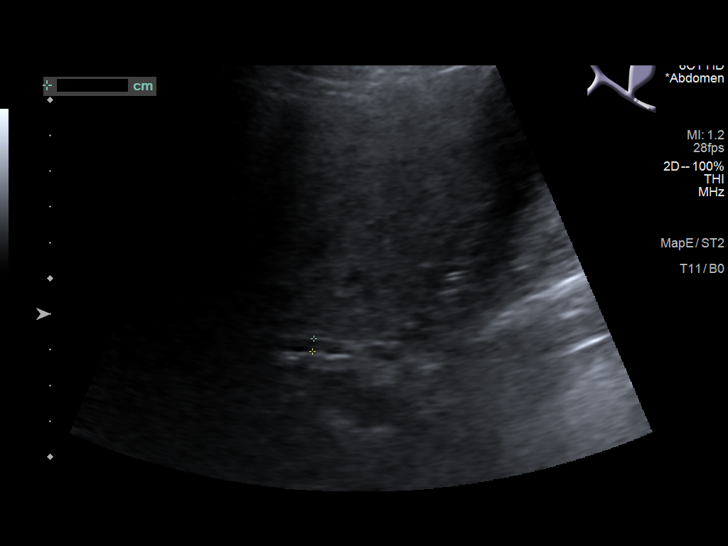
[im 17/45]
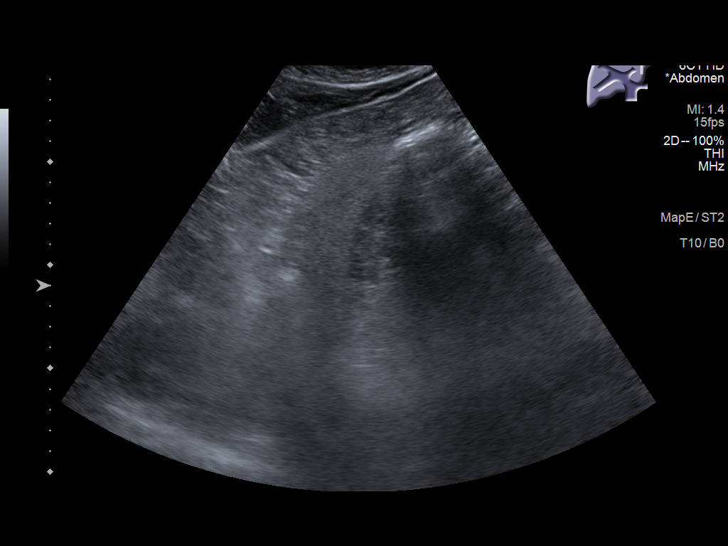
[im 21/45]
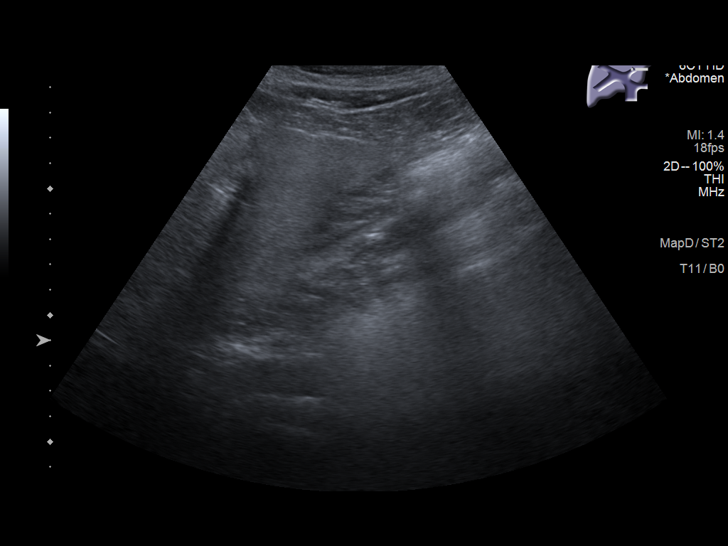
[im 24/45]
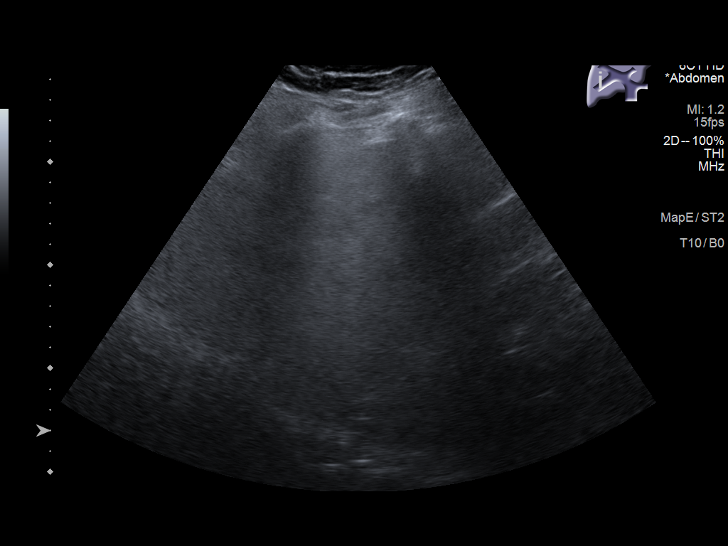
[im 28/45]
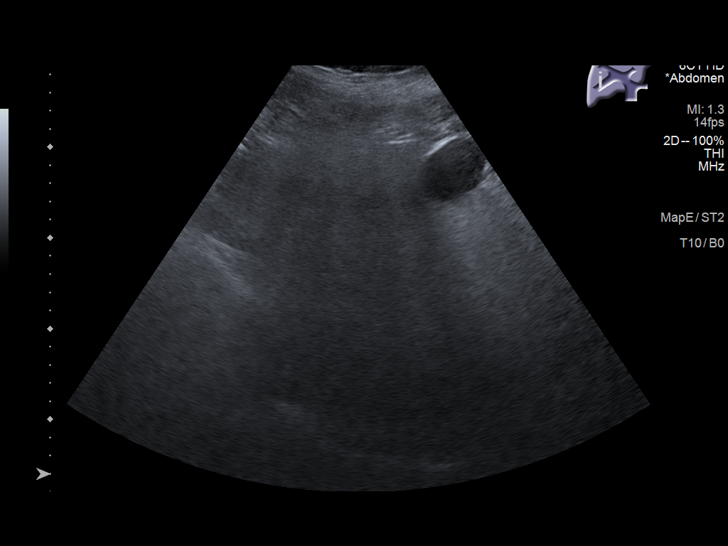
[im 30/45]
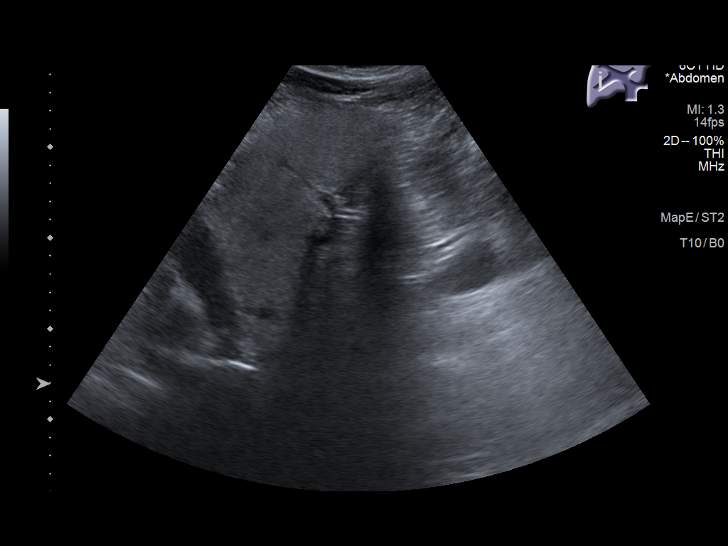
[im 34/45]
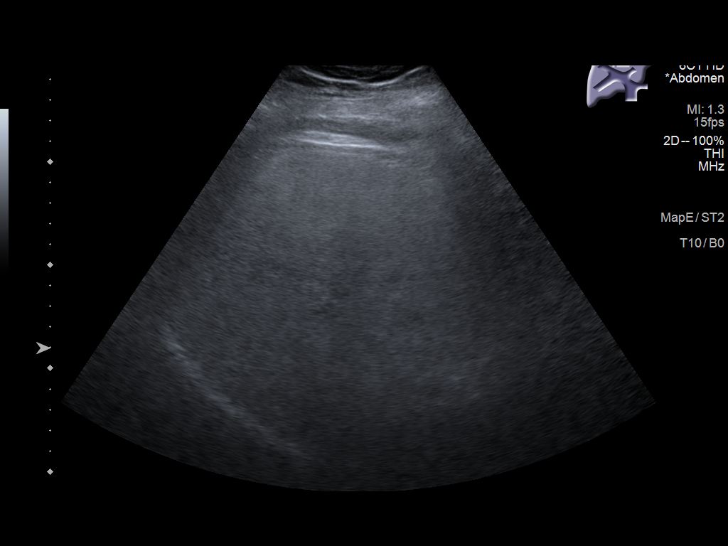
[im 37/45]
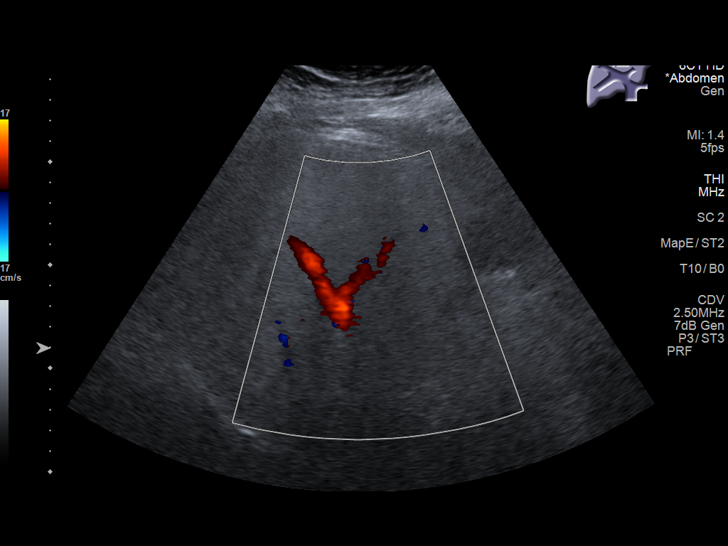
[im 41/45]
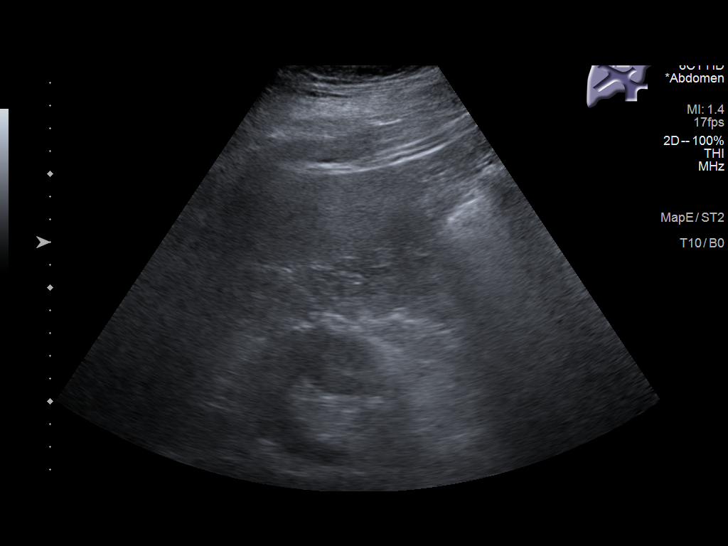
[im 45/45]
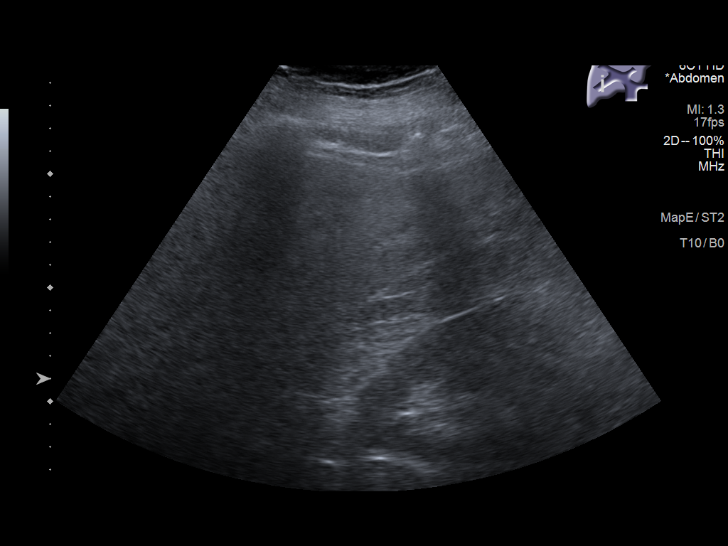

[14 of 25 positions shown; findings below may reference images not displayed]

FINDINGS: Gallbladder:

No gallstones or wall thickening visualized. No sonographic Murphy
sign noted by sonographer.

Common bile duct:

Diameter: 3.6 mm

Liver:

Increased echogenicity consistent fatty infiltration or
hepatocellular disease. No focal hepatic abnormality identified.
Portal vein is patent on color Doppler imaging with normal direction
of blood flow towards the liver.

Other: None.
IMPRESSION: 1.  No gallstones or biliary distention.

2. Increased hepatic echogenicity consistent fatty infiltration or
hepatocellular disease. No focal hepatic abnormality identified.
